# Patient Record
Sex: Female | Born: 1960 | Race: White | Hispanic: No | Marital: Single | Smoking: Former smoker
Health system: Southern US, Community
[De-identification: ages and names within clinical notes are randomized; demographics above are authoritative.]

## PROBLEM LIST (undated history)

## (undated) DIAGNOSIS — R519 Headache, unspecified: Secondary | ICD-10-CM

## (undated) DIAGNOSIS — R112 Nausea with vomiting, unspecified: Secondary | ICD-10-CM

## (undated) DIAGNOSIS — E785 Hyperlipidemia, unspecified: Secondary | ICD-10-CM

## (undated) DIAGNOSIS — Z9889 Other specified postprocedural states: Secondary | ICD-10-CM

## (undated) DIAGNOSIS — N6320 Unspecified lump in the left breast, unspecified quadrant: Secondary | ICD-10-CM

## (undated) DIAGNOSIS — Z803 Family history of malignant neoplasm of breast: Secondary | ICD-10-CM

## (undated) DIAGNOSIS — C801 Malignant (primary) neoplasm, unspecified: Secondary | ICD-10-CM

## (undated) DIAGNOSIS — T4145XA Adverse effect of unspecified anesthetic, initial encounter: Secondary | ICD-10-CM

## (undated) DIAGNOSIS — E079 Disorder of thyroid, unspecified: Secondary | ICD-10-CM

## (undated) DIAGNOSIS — Z808 Family history of malignant neoplasm of other organs or systems: Secondary | ICD-10-CM

## (undated) DIAGNOSIS — Z8041 Family history of malignant neoplasm of ovary: Secondary | ICD-10-CM

## (undated) DIAGNOSIS — Z8042 Family history of malignant neoplasm of prostate: Secondary | ICD-10-CM

## (undated) DIAGNOSIS — K219 Gastro-esophageal reflux disease without esophagitis: Secondary | ICD-10-CM

## (undated) DIAGNOSIS — Z853 Personal history of malignant neoplasm of breast: Secondary | ICD-10-CM

## (undated) DIAGNOSIS — E039 Hypothyroidism, unspecified: Secondary | ICD-10-CM

## (undated) DIAGNOSIS — I10 Essential (primary) hypertension: Secondary | ICD-10-CM

## (undated) DIAGNOSIS — T8859XA Other complications of anesthesia, initial encounter: Secondary | ICD-10-CM

## (undated) DIAGNOSIS — F419 Anxiety disorder, unspecified: Secondary | ICD-10-CM

## (undated) HISTORY — PX: CHOLECYSTECTOMY: SHX55

## (undated) HISTORY — DX: Family history of malignant neoplasm of other organs or systems: Z80.8

## (undated) HISTORY — DX: Essential (primary) hypertension: I10

## (undated) HISTORY — PX: BREAST SURGERY: SHX581

## (undated) HISTORY — DX: Family history of malignant neoplasm of ovary: Z80.41

## (undated) HISTORY — DX: Family history of malignant neoplasm of prostate: Z80.42

## (undated) HISTORY — PX: HEMORRHOID SURGERY: SHX153

## (undated) HISTORY — DX: Disorder of thyroid, unspecified: E07.9

## (undated) HISTORY — PX: OTHER SURGICAL HISTORY: SHX169

## (undated) HISTORY — DX: Family history of malignant neoplasm of breast: Z80.3

## (undated) HISTORY — DX: Hyperlipidemia, unspecified: E78.5

## (undated) HISTORY — PX: TUBAL LIGATION: SHX77

---
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
## 2006-04-21 ENCOUNTER — Emergency Department: Payer: Self-pay | Admitting: General Practice

## 2007-07-10 ENCOUNTER — Emergency Department: Payer: Self-pay | Admitting: Internal Medicine

## 2017-01-22 DIAGNOSIS — Z139 Encounter for screening, unspecified: Secondary | ICD-10-CM

## 2017-01-22 LAB — GLUCOSE, POCT (MANUAL RESULT ENTRY): POC Glucose: 106 mg/dl — AB (ref 70–99)

## 2017-01-22 NOTE — Congregational Nurse Program (Signed)
Congregational Nurse Program Note  Date of Encounter: 01/22/2017  Past Medical History: No past medical history on file.  Encounter Details:     CNP Questionnaire - 01/22/17 1530      Patient Demographics   Is this a new or existing patient? New   Patient is considered a/an Not Applicable   Race Caucasian/White     Patient Assistance   Location of Patient [place]   Patient's financial/insurance status Low Income;Self-Pay (Uninsured)   Uninsured Patient ([place]) Yes   Patient referred to apply for the following financial assistance Not Applicable   Food insecurities addressed Referred to food bank or resource   Transportation assistance No   Assistance securing medications No   Doctor, hospital the healthcare system;Medications;Hypertension;Chronic disease;Nutrition     Encounter Details   Primary purpose of visit Chronic Illness/Condition Visit;Education/Health Concerns;Navigating the Healthcare System   Was an Emergency Department visit averted? Not Applicable   Does patient have a medical provider? No   Patient referred to Area Agency;Clinic;Establish PCP   Was a mental health screening completed? (GAINS tool) No   Does patient have dental issues? No   Does patient have vision issues? No   Does your patient have an abnormal blood pressure today? No   Since previous encounter, have you referred patient for abnormal blood pressure that resulted in a new diagnosis or medication change? No   Does your patient have an abnormal blood glucose today? No   Since previous encounter, have you referred patient for abnormal blood glucose that resulted in a new diagnosis or medication change? No   Was there a life-saving intervention made? No      New client with [facility]. Client moved to [facility] last October 2017. She is unemployed and lives alone currently. She states her former boyfriend is paying her  bills and food and letting her stay in his house for now. She is trying to find employment and will be taking [employer] classes at [employer] starting next week.  food stamps due to qualifications. She states she was last seen by a medical provider in [place] last August. She states she has been treated for hypertension, pre-diabetes by diet, Increased cholesterol and thyroid issues. She states she has 4 lisinopril left.  No other complaints today other than worried she will not be able to get her medications and finding employment. Discussed with client options for medical providers without insurance and she prefers a referral into the [facility], referral made and appointment secured for 01/28/17 at 1:00 pm.  Client concerned she starts class on Monday and class ends at 1 pm .  RN suggested client check with class to inquire if she can leave a little early for a medical appointment. Client is going by today. Client will notify RN if she cannot be released from class early on the 01/28/17. Contact information given for the [facility], [employer] as well as RN's work number  Alert and oriented to person and place, answers appropriately. Client very talkative, no apparent distress. Denies thoughts of suicide. Client offered to meet with [employer] work interns for counseling and possible case management regarding classes or offerings for job skills , resume Automotive engineer. Client prefers to wait on a possible part-time job she may be getting first.  Will follow as needed.

## 2017-01-28 ENCOUNTER — Encounter: Payer: Self-pay | Admitting: Physician Assistant

## 2017-01-28 ENCOUNTER — Ambulatory Visit: Payer: Self-pay | Admitting: Physician Assistant

## 2017-01-28 VITALS — BP 152/86 | HR 99 | Temp 97.9°F | Ht 61.0 in | Wt 156.5 lb

## 2017-01-28 DIAGNOSIS — K219 Gastro-esophageal reflux disease without esophagitis: Secondary | ICD-10-CM

## 2017-01-28 DIAGNOSIS — Z1239 Encounter for other screening for malignant neoplasm of breast: Secondary | ICD-10-CM

## 2017-01-28 DIAGNOSIS — E039 Hypothyroidism, unspecified: Secondary | ICD-10-CM | POA: Insufficient documentation

## 2017-01-28 DIAGNOSIS — R7303 Prediabetes: Secondary | ICD-10-CM

## 2017-01-28 DIAGNOSIS — I10 Essential (primary) hypertension: Secondary | ICD-10-CM

## 2017-01-28 DIAGNOSIS — E785 Hyperlipidemia, unspecified: Secondary | ICD-10-CM

## 2017-01-28 MED ORDER — LISINOPRIL 5 MG PO TABS: 5.0000 mg | ORAL_TABLET | Freq: Every day | ORAL | 1 refills | Status: DC

## 2017-01-28 MED ORDER — LEVOTHYROXINE SODIUM 88 MCG PO TABS: 88.0000 ug | ORAL_TABLET | Freq: Every day | ORAL | 1 refills | Status: DC

## 2017-01-28 NOTE — Progress Notes (Signed)
BP (!) 152/86 (BP Location: Left Arm, Patient Position: Sitting, Cuff Size: Normal)   Pulse 99   Temp 97.9 F (36.6 C) (Other (Comment))   Ht 5\' 1"  (1.549 m)   Wt 156 lb 8 oz (71 kg)   SpO2 98%   BMI 29.57 kg/m    Subjective:    Patient ID: Emily Clark, female    DOB: 14-Jan-1961, 56 y.o.   MRN: [id number]  HPI: Emily Clark is a 56 y.o. female presenting on 01/28/2017 for New Patient (Initial Visit)   HPI   Pt moved from [place] recently.    Pt states she has been upset since yesterday.  Previously bp well controlled on lisinopril 5mg .  bp was good last week when checked by congregational nursing.   Pt states some sinus headache comes and goes for past week.  Pt says her last PAP was september 2017 but she doesn't remember the name of the doctor (in [place],  [place])  She says she has Never had mammogram  Relevant past medical, surgical, family and social history reviewed and updated as indicated. Interim medical history since our last visit reviewed. Allergies and medications reviewed and updated.   Current Outpatient Prescriptions:  .  levothyroxine (SYNTHROID, LEVOTHROID) 88 MCG tablet, Take 88 mcg by mouth 2 (two) times daily., Disp: , Rfl:  .  lisinopril (PRINIVIL,ZESTRIL) 5 MG tablet, Take 5 mg by mouth daily., Disp: , Rfl:  .  ranitidine (ZANTAC) 150 MG tablet, Take 150 mg by mouth daily as needed for heartburn., Disp: , Rfl:  .  gemfibrozil (LOPID) 600 MG tablet, Take 600 mg by mouth 2 (two) times daily before a meal., Disp: , Rfl:  .  Omega-3 Fatty Acids (FISH OIL) 1000 MG CAPS, Take by mouth., Disp: , Rfl:    Review of Systems  Constitutional: Negative for appetite change, chills, diaphoresis, fatigue, fever and unexpected weight change.  HENT: Positive for congestion and sneezing. Negative for dental problem, drooling, ear pain, facial swelling, hearing loss, mouth sores, sore throat, trouble swallowing and voice change.   Eyes: Negative for pain,  discharge, redness, itching and visual disturbance.  Respiratory: Negative for cough, choking, shortness of breath and wheezing.   Cardiovascular: Negative for chest pain, palpitations and leg swelling.  Gastrointestinal: Negative for abdominal pain, blood in stool, constipation, diarrhea and vomiting.  Endocrine: Negative for cold intolerance, heat intolerance and polydipsia.  Genitourinary: Negative for decreased urine volume, dysuria and hematuria.  Musculoskeletal: Negative for arthralgias, back pain and gait problem.  Skin: Negative for rash.  Allergic/Immunologic: Negative for environmental allergies.  Neurological: Positive for headaches. Negative for seizures, syncope and light-headedness.  Hematological: Negative for adenopathy.  Psychiatric/Behavioral: Negative for agitation, dysphoric mood and suicidal ideas. The patient is not nervous/anxious.     Per HPI unless specifically indicated above     Objective:    BP (!) 152/86 (BP Location: Left Arm, Patient Position: Sitting, Cuff Size: Normal)   Pulse 99   Temp 97.9 F (36.6 C) (Other (Comment))   Ht 5\' 1"  (1.549 m)   Wt 156 lb 8 oz (71 kg)   SpO2 98%   BMI 29.57 kg/m   Wt Readings from Last 3 Encounters:  01/28/17 156 lb 8 oz (71 kg)  01/22/17 159 lb 12.8 oz (72.5 kg)    Physical Exam  Constitutional: She is oriented to person, place, and time. She appears well-developed and well-nourished.  HENT:  Head: Normocephalic and atraumatic.  Mouth/Throat: Oropharynx is  clear and moist. No oropharyngeal exudate.  Eyes: Conjunctivae and EOM are normal. Pupils are equal, round, and reactive to light.  Neck: Neck supple. No thyromegaly present.  Cardiovascular: Normal rate and regular rhythm.   Pulmonary/Chest: Effort normal and breath sounds normal.  Abdominal: Soft. Bowel sounds are normal. She exhibits no mass. There is no hepatosplenomegaly. There is no tenderness.  Musculoskeletal: She exhibits no edema.   Lymphadenopathy:    She has no cervical adenopathy.  Neurological: She is alert and oriented to person, place, and time. Gait normal.  Skin: Skin is warm and dry.  Psychiatric: She has a normal mood and affect. Her behavior is normal.  Vitals reviewed.   Results for orders placed or performed in visit on 01/22/17  POCT glucose  Result Value Ref Range   POC Glucose 106 (A) 70 - 99 mg/dl      Assessment & Plan:   Encounter Diagnoses  Name Primary?  . Essential hypertension Yes  . Hyperlipidemia, unspecified hyperlipidemia type   . Hypothyroidism, unspecified type   . Gastroesophageal reflux disease, esophagitis presence not specified   . Prediabetes   . Screening for breast cancer      -will order screening mammogram -pt to get baseline Labs drawn -will Continue current levothyroxine and lisinopril -Pt will bring name of doctor who did her PAP to her next OV -pt to follow up in 1 month.  RTO sooner prn

## 2017-01-29 LAB — CBC WITH DIFFERENTIAL/PLATELET
BASOS PCT: 1 %
Basophils Absolute: 66 cells/uL (ref 0–200)
Eosinophils Absolute: 66 cells/uL (ref 15–500)
Eosinophils Relative: 1 %
HCT: 39.1 % (ref 35.0–45.0)
Hemoglobin: 13.3 g/dL (ref 11.7–15.5)
LYMPHS PCT: 40 %
Lymphs Abs: 2640 cells/uL (ref 850–3900)
MCH: 30.6 pg (ref 27.0–33.0)
MCHC: 34 g/dL (ref 32.0–36.0)
MCV: 90.1 fL (ref 80.0–100.0)
MONO ABS: 660 {cells}/uL (ref 200–950)
MPV: 9.4 fL (ref 7.5–12.5)
Monocytes Relative: 10 %
NEUTROS PCT: 48 %
Neutro Abs: 3168 cells/uL (ref 1500–7800)
Platelets: 346 10*3/uL (ref 140–400)
RBC: 4.34 MIL/uL (ref 3.80–5.10)
RDW: 14.1 % (ref 11.0–15.0)
WBC: 6.6 10*3/uL (ref 3.8–10.8)

## 2017-01-29 LAB — COMPREHENSIVE METABOLIC PANEL
ALT: 28 U/L (ref 6–29)
AST: 26 U/L (ref 10–35)
Albumin: 4.5 g/dL (ref 3.6–5.1)
Alkaline Phosphatase: 84 U/L (ref 33–130)
BUN: 18 mg/dL (ref 7–25)
CO2: 29 mmol/L (ref 20–31)
CREATININE: 0.84 mg/dL (ref 0.50–1.05)
Calcium: 9.6 mg/dL (ref 8.6–10.4)
Chloride: 103 mmol/L (ref 98–110)
Glucose, Bld: 125 mg/dL — ABNORMAL HIGH (ref 65–99)
Potassium: 4 mmol/L (ref 3.5–5.3)
Sodium: 143 mmol/L (ref 135–146)
Total Bilirubin: 0.4 mg/dL (ref 0.2–1.2)
Total Protein: 6.9 g/dL (ref 6.1–8.1)

## 2017-01-29 LAB — LIPID PANEL
CHOLESTEROL: 273 mg/dL — AB (ref <200)
HDL: 52 mg/dL (ref >50)
LDL Cholesterol: 181 mg/dL — ABNORMAL HIGH (ref <100)
TRIGLYCERIDES: 198 mg/dL — AB (ref <150)
Total CHOL/HDL Ratio: 5.3 Ratio — ABNORMAL HIGH (ref <5.0)
VLDL: 40 mg/dL — ABNORMAL HIGH (ref <30)

## 2017-01-29 LAB — TSH: TSH: 13.41 mIU/L — ABNORMAL HIGH

## 2017-01-30 ENCOUNTER — Telehealth: Payer: Self-pay

## 2017-01-30 LAB — HEMOGLOBIN A1C
Hgb A1c MFr Bld: 5.8 % — ABNORMAL HIGH (ref <5.7)
Mean Plasma Glucose: 120 mg/dL

## 2017-01-30 NOTE — Telephone Encounter (Signed)
Pt was called today by nurse to follow-up with her visit from the [facility] 01/28/17.  Pt said she had blood drawn yesterday 01/29/17.   Pt stated she was depressed because she can't find a job that she wants paying 47 and with insurance.  Nurse offered to come in at [facility] to talk to our [employer].   Pt sates she already has resources like her friends and a questionable number to talk to someone to pray about it.  Nurse offered to call back if she needs any help and we could help as much as we can.   Camren Lipsett R. Hadasah Brugger LPN [phone]

## 2017-02-21 ENCOUNTER — Ambulatory Visit (HOSPITAL_COMMUNITY): Payer: Self-pay

## 2017-02-27 ENCOUNTER — Ambulatory Visit: Payer: Self-pay | Admitting: Physician Assistant

## 2017-02-28 ENCOUNTER — Ambulatory Visit (HOSPITAL_COMMUNITY): Payer: Self-pay

## 2017-02-28 ENCOUNTER — Encounter (HOSPITAL_COMMUNITY): Payer: Self-pay

## 2017-03-27 ENCOUNTER — Ambulatory Visit: Payer: Self-pay | Admitting: Physician Assistant

## 2017-03-27 ENCOUNTER — Encounter: Payer: Self-pay | Admitting: Physician Assistant

## 2017-03-27 VITALS — BP 146/88 | HR 116 | Temp 98.8°F | Ht 61.0 in | Wt 155.5 lb

## 2017-03-27 DIAGNOSIS — J069 Acute upper respiratory infection, unspecified: Secondary | ICD-10-CM

## 2017-03-27 DIAGNOSIS — E785 Hyperlipidemia, unspecified: Secondary | ICD-10-CM

## 2017-03-27 DIAGNOSIS — R000 Tachycardia, unspecified: Secondary | ICD-10-CM

## 2017-03-27 DIAGNOSIS — I10 Essential (primary) hypertension: Secondary | ICD-10-CM

## 2017-03-27 DIAGNOSIS — K219 Gastro-esophageal reflux disease without esophagitis: Secondary | ICD-10-CM

## 2017-03-27 DIAGNOSIS — E039 Hypothyroidism, unspecified: Secondary | ICD-10-CM

## 2017-03-27 DIAGNOSIS — R509 Fever, unspecified: Secondary | ICD-10-CM

## 2017-03-27 MED ORDER — LISINOPRIL 5 MG PO TABS: 5.0000 mg | ORAL_TABLET | Freq: Every day | ORAL | 4 refills | Status: DC

## 2017-03-27 MED ORDER — ACETAMINOPHEN 325 MG PO TABS
650.0000 mg | ORAL_TABLET | Freq: Once | ORAL | Status: AC
  Administered 2017-03-27: 650 mg via ORAL

## 2017-03-27 MED ORDER — LEVOTHYROXINE SODIUM 88 MCG PO TABS: 88.0000 ug | ORAL_TABLET | Freq: Every day | ORAL | 4 refills | Status: DC

## 2017-03-27 MED ORDER — ATENOLOL 25 MG PO TABS: 25.0000 mg | ORAL_TABLET | Freq: Every day | ORAL | 1 refills | Status: DC

## 2017-03-27 NOTE — Progress Notes (Signed)
BP (!) 146/88   Pulse (!) 127   Temp 99.5 F (37.5 C)   Wt 155 lb 8 oz (70.5 kg)   SpO2 98%   BMI 29.38 kg/m    Subjective:    Patient ID: Emily Clark, female    DOB: 07-19-1961, 56 y.o.   MRN: [id number]  HPI: Emily Clark is a 56 y.o. female presenting on 03/27/2017 for Hypertension; Hyperlipidemia; Thyroid Problem; and Cough (pt states she woke up not feeling well. pt states as the day progresses she is feeling worse.)   HPI    Chief Complaint  Patient presents with  . Hypertension  . Hyperlipidemia  . Thyroid Problem  . Cough    pt states she woke up not feeling well. pt states as the day progresses she is feeling worse.    Pt here for routine follow up but says she feels bad.  She says she was well yesterday and just awakened today feeling bad.   C/o cough. Also L earach.  + nausea. No emesis.  A little bit of diarrhea  Pt Wants rx for retin-A to prevent wrinkles.  She also uses castor oil for this.   Pt asks about getting bladder tacked back up.  Her gyn said she needed that.  She says she leaks urine, a lot at times.   Relevant past medical, surgical, family and social history reviewed and updated as indicated. Interim medical history since our last visit reviewed. Allergies and medications reviewed and updated.  CURRENT MEDS: Levothyroxine Lisinopril Fish oil OTC prilosec  Review of Systems  Constitutional: Negative for appetite change, chills, diaphoresis, fatigue, fever and unexpected weight change.  HENT: Positive for ear pain. Negative for congestion, dental problem, drooling, facial swelling, hearing loss, mouth sores, sneezing, sore throat, trouble swallowing and voice change.   Eyes: Negative for pain, discharge, redness, itching and visual disturbance.  Respiratory: Negative for cough, choking, shortness of breath and wheezing.   Cardiovascular: Negative for chest pain, palpitations and leg swelling.  Gastrointestinal: Negative for  abdominal pain, blood in stool, constipation, diarrhea and vomiting.  Endocrine: Negative for cold intolerance, heat intolerance and polydipsia.  Genitourinary: Negative for decreased urine volume, dysuria and hematuria.  Musculoskeletal: Negative for arthralgias, back pain and gait problem.  Skin: Negative for rash.  Allergic/Immunologic: Negative for environmental allergies.  Neurological: Negative for seizures, syncope, light-headedness and headaches.  Hematological: Negative for adenopathy.  Psychiatric/Behavioral: Negative for agitation, dysphoric mood and suicidal ideas. The patient is not nervous/anxious.     Per HPI unless specifically indicated above     Objective:    BP (!) 146/88   Pulse (!) 127   Temp 99.5 F (37.5 C)   Wt 155 lb 8 oz (70.5 kg)   SpO2 98%   BMI 29.38 kg/m   Wt Readings from Last 3 Encounters:  03/27/17 155 lb 8 oz (70.5 kg)  01/28/17 156 lb 8 oz (71 kg)  01/22/17 159 lb 12.8 oz (72.5 kg)    Physical Exam  Constitutional: She is oriented to person, place, and time. She appears well-developed and well-nourished.  HENT:  Head: Normocephalic and atraumatic.  Right Ear: Hearing, tympanic membrane, external ear and ear canal normal.  Left Ear: Hearing, tympanic membrane, external ear and ear canal normal.  Nose: Nose normal.  Mouth/Throat: Uvula is midline and oropharynx is clear and moist. No oropharyngeal exudate.  Neck: Neck supple.  Cardiovascular: Normal rate and regular rhythm.   Pulmonary/Chest: Effort normal and  breath sounds normal. She has no wheezes.  Abdominal: Soft. Bowel sounds are normal. She exhibits no mass. There is no hepatosplenomegaly. There is no tenderness.  Musculoskeletal: She exhibits no edema.  Lymphadenopathy:    She has no cervical adenopathy.  Neurological: She is alert and oriented to person, place, and time.  Skin: Skin is warm and dry.  Psychiatric: She has a normal mood and affect. Her behavior is normal.  Vitals  reviewed.   Results for orders placed or performed in visit on 01/28/17  CBC w/Diff/Platelet  Result Value Ref Range   WBC 6.6 3.8 - 10.8 K/uL   RBC 4.34 3.80 - 5.10 MIL/uL   Hemoglobin 13.3 11.7 - 15.5 g/dL   HCT 39.1 35.0 - 45.0 %   MCV 90.1 80.0 - 100.0 fL   MCH 30.6 27.0 - 33.0 pg   MCHC 34.0 32.0 - 36.0 g/dL   RDW 14.1 11.0 - 15.0 %   Platelets 346 140 - 400 K/uL   MPV 9.4 7.5 - 12.5 fL   Neutro Abs 3,168 1,500 - 7,800 cells/uL   Lymphs Abs 2,640 850 - 3,900 cells/uL   Monocytes Absolute 660 200 - 950 cells/uL   Eosinophils Absolute 66 15 - 500 cells/uL   Basophils Absolute 66 0 - 200 cells/uL   Neutrophils Relative % 48 %   Lymphocytes Relative 40 %   Monocytes Relative 10 %   Eosinophils Relative 1 %   Basophils Relative 1 %   Smear Review Criteria for review not met   TSH  Result Value Ref Range   TSH 13.41 (H) mIU/L  Lipid Profile  Result Value Ref Range   Cholesterol 273 (H) <200 mg/dL   Triglycerides 198 (H) <150 mg/dL   HDL 52 >50 mg/dL   Total CHOL/HDL Ratio 5.3 (H) <5.0 Ratio   VLDL 40 (H) <30 mg/dL   LDL Cholesterol 181 (H) <100 mg/dL  Comprehensive Metabolic Panel (CMET)  Result Value Ref Range   Sodium 143 135 - 146 mmol/L   Potassium 4.0 3.5 - 5.3 mmol/L   Chloride 103 98 - 110 mmol/L   CO2 29 20 - 31 mmol/L   Glucose, Bld 125 (H) 65 - 99 mg/dL   BUN 18 7 - 25 mg/dL   Creat 0.84 0.50 - 1.05 mg/dL   Total Bilirubin 0.4 0.2 - 1.2 mg/dL   Alkaline Phosphatase 84 33 - 130 U/L   AST 26 10 - 35 U/L   ALT 28 6 - 29 U/L   Total Protein 6.9 6.1 - 8.1 g/dL   Albumin 4.5 3.6 - 5.1 g/dL   Calcium 9.6 8.6 - 10.4 mg/dL  HgB A1c  Result Value Ref Range   Hgb A1c MFr Bld 5.8 (H) <5.7 %   Mean Plasma Glucose 120 mg/dL      Assessment & Plan:   Encounter Diagnoses  Name Primary?  . Fever, unspecified fever cause Yes  . Tachycardia   . Viral upper respiratory tract infection   . Essential hypertension   . Hyperlipidemia, unspecified hyperlipidemia  type   . Hypothyroidism, unspecified type   . Gastroesophageal reflux disease, esophagitis presence not specified     -reviewed labs with pt -pt was given tylenol in office.  Temp decreased and pulse decreased and she reported feeling improved -Pt says she can get lipids down with her diet.  She doesn't want rx -Encouraged pt to stay home from work tonight due to sick. She is given note to be  out of work -will Continue current levothyroxine -Change lisinopril to atenolol to help pulse -f/u 1 month.  RTO sooner prn.  Told pt will discuss her multitude of issues at follow up appointment

## 2017-04-21 ENCOUNTER — Ambulatory Visit: Payer: Self-pay | Admitting: Physician Assistant

## 2017-04-21 ENCOUNTER — Encounter: Payer: Self-pay | Admitting: Physician Assistant

## 2017-04-21 VITALS — BP 130/86 | HR 91 | Temp 97.7°F | Ht 61.0 in | Wt 155.8 lb

## 2017-04-21 DIAGNOSIS — E785 Hyperlipidemia, unspecified: Secondary | ICD-10-CM

## 2017-04-21 DIAGNOSIS — Z1239 Encounter for other screening for malignant neoplasm of breast: Secondary | ICD-10-CM

## 2017-04-21 DIAGNOSIS — I10 Essential (primary) hypertension: Secondary | ICD-10-CM

## 2017-04-21 DIAGNOSIS — E039 Hypothyroidism, unspecified: Secondary | ICD-10-CM

## 2017-04-21 MED ORDER — LEVOTHYROXINE SODIUM 100 MCG PO TABS: 100.0000 ug | ORAL_TABLET | Freq: Every day | ORAL | 3 refills | Status: DC

## 2017-04-21 NOTE — Progress Notes (Signed)
BP 130/86 (BP Location: Left Arm, Patient Position: Sitting, Cuff Size: Normal)   Pulse 91   Temp 97.7 F (36.5 C)   Ht 5\' 1"  (1.549 m)   Wt 155 lb 12 oz (70.6 kg)   SpO2 98%   BMI 29.43 kg/m    Subjective:    Patient ID: Emily Clark, female    DOB: Jan 28, 1961, 56 y.o.   MRN: [id number]  HPI: Emily Clark is a 56 y.o. female presenting on 04/21/2017 for Hypertension   HPI   Pt says she is feeling better than at previous OV.  She says she is tired a lot and feeling like she does need her thyroid med ajusted- feels tired  Pt works at [employer] says she will likely be getting insurance soon because she doesn't want the penalty at the end of the year.   Relevant past medical, surgical, family and social history reviewed and updated as indicated. Interim medical history since our last visit reviewed. Allergies and medications reviewed and updated.   Current Outpatient Prescriptions:  .  atenolol (TENORMIN) 25 MG tablet, Take 1 tablet (25 mg total) by mouth daily., Disp: 30 tablet, Rfl: 1 .  levothyroxine (SYNTHROID, LEVOTHROID) 88 MCG tablet, Take 1 tablet (88 mcg total) by mouth daily., Disp: 30 tablet, Rfl: 4 .  Omega-3 Fatty Acids (FISH OIL) 1000 MG CAPS, Take by mouth., Disp: , Rfl:    Review of Systems  Constitutional: Negative for appetite change, chills, diaphoresis, fatigue, fever and unexpected weight change.  HENT: Negative for congestion, dental problem, drooling, ear pain, facial swelling, hearing loss, mouth sores, sneezing, sore throat, trouble swallowing and voice change.   Eyes: Negative for pain, discharge, redness, itching and visual disturbance.  Respiratory: Negative for cough, choking, shortness of breath and wheezing.   Cardiovascular: Negative for chest pain, palpitations and leg swelling.  Gastrointestinal: Negative for abdominal pain, blood in stool, constipation, diarrhea and vomiting.  Endocrine: Negative for cold intolerance, heat  intolerance and polydipsia.  Genitourinary: Negative for decreased urine volume, dysuria and hematuria.  Musculoskeletal: Negative for arthralgias, back pain and gait problem.  Skin: Negative for rash.  Allergic/Immunologic: Negative for environmental allergies.  Neurological: Negative for seizures, syncope, light-headedness and headaches.  Hematological: Negative for adenopathy.  Psychiatric/Behavioral: Negative for agitation, dysphoric mood and suicidal ideas. The patient is not nervous/anxious.     Per HPI unless specifically indicated above     Objective:    BP 130/86 (BP Location: Left Arm, Patient Position: Sitting, Cuff Size: Normal)   Pulse 91   Temp 97.7 F (36.5 C)   Ht 5\' 1"  (1.549 m)   Wt 155 lb 12 oz (70.6 kg)   SpO2 98%   BMI 29.43 kg/m   Wt Readings from Last 3 Encounters:  04/21/17 155 lb 12 oz (70.6 kg)  03/27/17 155 lb 8 oz (70.5 kg)  01/28/17 156 lb 8 oz (71 kg)    Physical Exam  Constitutional: She is oriented to person, place, and time. She appears well-developed and well-nourished.  HENT:  Head: Normocephalic and atraumatic.  Neck: Neck supple.  Cardiovascular: Normal rate and regular rhythm.   Pulmonary/Chest: Effort normal and breath sounds normal.  Abdominal: Soft. Bowel sounds are normal. She exhibits no mass. There is no hepatosplenomegaly. There is no tenderness.  Musculoskeletal: She exhibits no edema.  Lymphadenopathy:    She has no cervical adenopathy.  Neurological: She is alert and oriented to person, place, and time.  Skin: Skin is warm and dry.  Psychiatric: She has a normal mood and affect. Her behavior is normal.  Vitals reviewed.       Assessment & Plan:   Encounter Diagnoses  Name Primary?  . Hypothyroidism, unspecified type Yes  . Essential hypertension   . Hyperlipidemia, unspecified hyperlipidemia type   . Screening for breast cancer      -Pt doesn't want medication for lipids. She isn't eating lowfat yet but says  she is planning to.  She doesn't want rx. -Increase synthroid to 176mcg- recheck 3 months -will reschedule mammogram -pt states she had colonoscopy with polyps - ? In 2016 or 2017- Pt will bring name of dr to next appt so we can request those records -F/u 3 months. RTO sooner prn.  Pt to cancel appointment if she has insurance at that time

## 2017-05-01 ENCOUNTER — Encounter: Payer: Self-pay | Admitting: Physician Assistant

## 2017-06-13 ENCOUNTER — Other Ambulatory Visit: Payer: Self-pay | Admitting: Physician Assistant

## 2017-07-01 ENCOUNTER — Encounter: Payer: Self-pay | Admitting: Physician Assistant

## 2017-07-02 NOTE — Progress Notes (Addendum)
Pt called and left voicemail on the weekend of 06-27-17 c/o really bad allergies and having watery eyes for 2 months. Pt states she has tried zyrtec, Claritin, allegra, eye drops, and nasal spray. Pt asking if [facility] gave out the allergy shot.   LPN called and spoke with patient on 07-01-17 and notified pt that the office does not provide allergy shots, but could schedule patient an appointment for 07-02-17, considering she has already taken several OTC allergy medicines and has been symptomatic for 2 months now.   Pt stated she didn't think coming in to be seen would help. Pt was explained that being seen could help so she could be examined in order to see if it is something other than allergies. Pt states she knows she has allergies and knows that all she needs is an "allergy shot" in order to get better. Pt refused to schedule appointment.  Pt was advised to call back if she changed her mind about scheduling an appointment. Pt verbalized understanding.

## 2017-07-16 ENCOUNTER — Other Ambulatory Visit: Payer: Self-pay | Admitting: Physician Assistant

## 2017-07-16 DIAGNOSIS — Z1231 Encounter for screening mammogram for malignant neoplasm of breast: Secondary | ICD-10-CM

## 2017-07-23 ENCOUNTER — Ambulatory Visit: Payer: Self-pay | Admitting: Physician Assistant

## 2017-07-24 ENCOUNTER — Ambulatory Visit: Payer: Self-pay | Admitting: Physician Assistant

## 2017-08-07 ENCOUNTER — Telehealth (HOSPITAL_COMMUNITY): Payer: Self-pay

## 2017-08-07 NOTE — Telephone Encounter (Signed)
Called patient to schedule mammogram appointment with [facility] ([facility] sent [place] patient's info for mammo scholarship). Patient said she was not interested in having a mammogram done at this time even though she has never had one before. I spoke with her about the importance of having a yearly mammogram done and told her she could call us back if she changed her mind.

## 2017-09-08 ENCOUNTER — Ambulatory Visit: Payer: Self-pay | Admitting: Physician Assistant

## 2017-09-08 ENCOUNTER — Encounter: Payer: Self-pay | Admitting: Physician Assistant

## 2017-09-08 VITALS — BP 144/80 | HR 105 | Temp 97.9°F | Ht 61.0 in | Wt 156.0 lb

## 2017-09-08 DIAGNOSIS — I10 Essential (primary) hypertension: Secondary | ICD-10-CM

## 2017-09-08 DIAGNOSIS — E785 Hyperlipidemia, unspecified: Secondary | ICD-10-CM

## 2017-09-08 DIAGNOSIS — E039 Hypothyroidism, unspecified: Secondary | ICD-10-CM

## 2017-09-08 DIAGNOSIS — J019 Acute sinusitis, unspecified: Secondary | ICD-10-CM

## 2017-09-08 DIAGNOSIS — Z5320 Procedure and treatment not carried out because of patient's decision for unspecified reasons: Secondary | ICD-10-CM | POA: Insufficient documentation

## 2017-09-08 MED ORDER — SULFAMETHOXAZOLE-TRIMETHOPRIM 800-160 MG PO TABS: 1.0000 | ORAL_TABLET | Freq: Two times a day (BID) | ORAL | 0 refills | Status: AC

## 2017-09-08 MED ORDER — ATENOLOL 25 MG PO TABS: 25.0000 mg | ORAL_TABLET | Freq: Every day | ORAL | 1 refills | Status: DC

## 2017-09-08 MED ORDER — LEVOTHYROXINE SODIUM 100 MCG PO TABS: 100.0000 ug | ORAL_TABLET | Freq: Every day | ORAL | 1 refills | Status: DC

## 2017-09-08 NOTE — Progress Notes (Signed)
BP (!) 144/80 (BP Location: Left Arm, Patient Position: Sitting, Cuff Size: Normal)   Pulse (!) 105   Temp 97.9 F (36.6 C)   Ht 5\' 1"  (1.549 m)   Wt 156 lb (70.8 kg)   SpO2 97%   BMI 29.48 kg/m    Subjective:    Patient ID: Emily Clark, female    DOB: 05/09/1961, 56 y.o.   MRN: [id number]  HPI:.   Emily Clark is a 56 y.o. female presenting on 09/08/2017 for Allergies   HPI   States eyes dripping water.  Pain behind eyes for couple of months.  Sudafed helped some.  Most OTCs didn't help any.  Lots of pressure and pain. + congestion.   Pt says she missed her appointments last month due to no money- spent it on her car.   Pt still working at [employer]  Relevant past medical, surgical, family and social history reviewed and updated as indicated. Interim medical history since our last visit reviewed. Allergies and medications reviewed and updated.    Current Outpatient Prescriptions:  .  Flaxseed, Linseed, (FLAX SEED OIL PO), Take by mouth every other day., Disp: , Rfl:  .  levothyroxine (SYNTHROID, LEVOTHROID) 100 MCG tablet, Take 1 tablet (100 mcg total) by mouth daily., Disp: 30 tablet, Rfl: 3 .  atenolol (TENORMIN) 25 MG tablet, TAKE 1 TABLET BY MOUTH ONCE DAILY (Patient not taking: Reported on 09/08/2017), Disp: 30 tablet, Rfl: 1 .  Omega-3 Fatty Acids (FISH OIL) 1000 MG CAPS, Take by mouth., Disp: , Rfl:   Review of Systems  Constitutional: Negative for appetite change, chills, diaphoresis, fatigue, fever and unexpected weight change.  HENT: Positive for congestion and facial swelling. Negative for dental problem, drooling, ear pain, hearing loss, mouth sores, sneezing, sore throat, trouble swallowing and voice change.   Eyes: Positive for redness and visual disturbance. Negative for pain, discharge and itching.  Respiratory: Negative for cough, choking, shortness of breath and wheezing.   Cardiovascular: Negative for chest pain, palpitations and leg  swelling.  Gastrointestinal: Negative for abdominal pain, blood in stool, constipation, diarrhea and vomiting.  Endocrine: Negative for cold intolerance, heat intolerance and polydipsia.  Genitourinary: Negative for decreased urine volume, dysuria and hematuria.  Musculoskeletal: Negative for arthralgias, back pain and gait problem.  Skin: Negative for rash.  Allergic/Immunologic: Positive for environmental allergies.  Neurological: Positive for headaches. Negative for seizures, syncope and light-headedness.  Hematological: Negative for adenopathy.  Psychiatric/Behavioral: Negative for agitation, dysphoric mood and suicidal ideas. The patient is not nervous/anxious.     Per HPI unless specifically indicated above     Objective:    BP (!) 144/80 (BP Location: Left Arm, Patient Position: Sitting, Cuff Size: Normal)   Pulse (!) 105   Temp 97.9 F (36.6 C)   Ht 5\' 1"  (1.549 m)   Wt 156 lb (70.8 kg)   SpO2 97%   BMI 29.48 kg/m   Wt Readings from Last 3 Encounters:  09/08/17 156 lb (70.8 kg)  04/21/17 155 lb 12 oz (70.6 kg)  03/27/17 155 lb 8 oz (70.5 kg)    Physical Exam  Constitutional: She is oriented to person, place, and time. She appears well-developed and well-nourished.  HENT:  Head: Normocephalic and atraumatic.  Right Ear: Hearing, tympanic membrane, external ear and ear canal normal.  Left Ear: Hearing, tympanic membrane, external ear and ear canal normal.  Nose: Rhinorrhea present. Right sinus exhibits maxillary sinus tenderness. Left sinus exhibits maxillary sinus tenderness.  Mouth/Throat: Uvula is midline and oropharynx is clear and moist. No oropharyngeal exudate.  Neck: Neck supple.  Cardiovascular: Normal rate and regular rhythm.   Pulmonary/Chest: Effort normal and breath sounds normal. She has no wheezes.  Abdominal: Soft. Bowel sounds are normal. She exhibits no mass. There is no hepatosplenomegaly. There is no tenderness.  Musculoskeletal: She exhibits no  edema.  Lymphadenopathy:    She has no cervical adenopathy.  Neurological: She is alert and oriented to person, place, and time.  Skin: Skin is warm and dry.  Psychiatric: She has a normal mood and affect. Her behavior is normal.  Vitals reviewed.       Assessment & Plan:   Encounter Diagnoses  Name Primary?  . Essential hypertension Yes  . Hypothyroidism, unspecified type   . Hyperlipidemia, unspecified hyperlipidemia type   . Mammogram declined   . Subacute sinusitis, unspecified location     -pt to get fasting labs drawn this week or next.  We will call her with results -discussed screening mammogram guidelines but Pt declined mammogram stating she didn't want the pain -rx septra ds for sinus due to pcn allergy and financial limitations for other antibiotics -pt to Follow up 3 months.  RTO sooner prn

## 2017-10-23 ENCOUNTER — Ambulatory Visit: Payer: Self-pay | Admitting: Physician Assistant

## 2017-10-23 ENCOUNTER — Encounter: Payer: Self-pay | Admitting: Physician Assistant

## 2017-10-23 VITALS — BP 139/74 | HR 90 | Temp 97.9°F | Wt 157.5 lb

## 2017-10-23 DIAGNOSIS — J069 Acute upper respiratory infection, unspecified: Secondary | ICD-10-CM

## 2017-10-23 MED ORDER — BENZONATATE 100 MG PO CAPS: ORAL_CAPSULE | ORAL | 2 refills | Status: DC

## 2017-10-23 MED ORDER — AZITHROMYCIN 250 MG PO TABS: ORAL_TABLET | ORAL | 0 refills | Status: DC

## 2017-10-23 NOTE — Progress Notes (Signed)
BP 139/74 (BP Location: Right Arm, Patient Position: Sitting, Cuff Size: Normal)   Pulse 90   Temp 97.9 F (36.6 C)   Wt 157 lb 8 oz (71.4 kg)   SpO2 95%   BMI 29.76 kg/m    Subjective:    Patient ID: Emily Clark, female    DOB: August 03, 1961, 56 y.o.   MRN: [id number]  HPI: Emily Clark is a 56 y.o. female presenting on 10/23/2017 for Nasal Congestion (since saturday. sore throat, cough, hoarse, sneezing, HA, yellow and green mucous, sub fever. pt states she has taken tylenol sinus, ginger tea)   HPI   Chief Complaint  Patient presents with  . Nasal Congestion    since saturday. sore throat, cough, hoarse, sneezing, HA, yellow and green mucous, sub fever. pt states she has taken tylenol sinus, ginger tea    Pt has still not gotten labs drawn yet from October.  Pt states delsym 12 hour works well.     Relevant past medical, surgical, family and social history reviewed and updated as indicated. Interim medical history since our last visit reviewed. Allergies and medications reviewed and updated.   Current Outpatient Medications:  .  atenolol (TENORMIN) 25 MG tablet, Take 1 tablet (25 mg total) by mouth daily., Disp: 30 tablet, Rfl: 1 .  Flaxseed, Linseed, (FLAX SEED OIL PO), Take by mouth every other day., Disp: , Rfl:  .  levothyroxine (SYNTHROID, LEVOTHROID) 100 MCG tablet, Take 1 tablet (100 mcg total) by mouth daily., Disp: 30 tablet, Rfl: 1 .  Omega-3 Fatty Acids (FISH OIL) 1000 MG CAPS, Take by mouth., Disp: , Rfl:    Review of Systems  Constitutional: Positive for appetite change and fever (subjective only).  HENT: Positive for congestion, sneezing, sore throat and voice change.   Respiratory: Positive for cough. Negative for wheezing.   Cardiovascular: Negative for chest pain.  Gastrointestinal: Negative for abdominal pain.    Per HPI unless specifically indicated above     Objective:    BP 139/74 (BP Location: Right Arm, Patient Position: Sitting,  Cuff Size: Normal)   Pulse 90   Temp 97.9 F (36.6 C)   Wt 157 lb 8 oz (71.4 kg)   SpO2 95%   BMI 29.76 kg/m   Wt Readings from Last 3 Encounters:  10/23/17 157 lb 8 oz (71.4 kg)  09/08/17 156 lb (70.8 kg)  04/21/17 155 lb 12 oz (70.6 kg)    Physical Exam  Constitutional: She is oriented to person, place, and time. She appears well-developed and well-nourished.  HENT:  Head: Normocephalic and atraumatic.  Right Ear: Hearing, tympanic membrane, external ear and ear canal normal.  Left Ear: Hearing, tympanic membrane, external ear and ear canal normal.  Nose: Rhinorrhea present.  Mouth/Throat: Uvula is midline and oropharynx is clear and moist. No oropharyngeal exudate.  + nasal congestion  Neck: Neck supple.  Cardiovascular: Normal rate and regular rhythm.  Pulmonary/Chest: Effort normal and breath sounds normal. She has no wheezes.  Lymphadenopathy:    She has no cervical adenopathy.  Neurological: She is alert and oriented to person, place, and time.  Skin: Skin is warm and dry.  Psychiatric: She has a normal mood and affect. Her behavior is normal.  Vitals reviewed.       Assessment & Plan:    Encounter Diagnosis  Name Primary?  Marland Kitchen Upper respiratory tract infection, unspecified type Yes    -discussed with pt the her condition is most likely viral.  She is very unhappy with that and insists she needs antibiotic -pt is given reading information on URI and is urged to read it to learn more about viral URI -counseled rest, fluids -pt reminded she needs to get labs drawn -follow up as scheduled. RTO sooner pnr

## 2017-10-23 NOTE — Patient Instructions (Signed)
Upper Respiratory Infection, Adult Most upper respiratory infections (URIs) are a viral infection of the air passages leading to the lungs. A URI affects the nose, throat, and upper air passages. The most common type of URI is nasopharyngitis and is typically referred to as "the common cold." URIs run their course and usually go away on their own. Most of the time, a URI does not require medical attention, but sometimes a bacterial infection in the upper airways can follow a viral infection. This is called a secondary infection. Sinus and middle ear infections are common types of secondary upper respiratory infections. Bacterial pneumonia can also complicate a URI. A URI can worsen asthma and chronic obstructive pulmonary disease (COPD). Sometimes, these complications can require emergency medical care and may be life threatening. What are the causes? Almost all URIs are caused by viruses. A virus is a type of germ and can spread from one person to another. What increases the risk? You may be at risk for a URI if:  You smoke.  You have chronic heart or lung disease.  You have a weakened defense (immune) system.  You are very young or very old.  You have nasal allergies or asthma.  You work in crowded or poorly ventilated areas.  You work in health care facilities or schools.  What are the signs or symptoms? Symptoms typically develop 2-3 days after you come in contact with a cold virus. Most viral URIs last 7-10 days. However, viral URIs from the influenza virus (flu virus) can last 14-18 days and are typically more severe. Symptoms may include:  Runny or stuffy (congested) nose.  Sneezing.  Cough.  Sore throat.  Headache.  Fatigue.  Fever.  Loss of appetite.  Pain in your forehead, behind your eyes, and over your cheekbones (sinus pain).  Muscle aches.  How is this diagnosed? Your health care provider may diagnose a URI by:  Physical exam.  Tests to check that your  symptoms are not due to another condition such as: ? Strep throat. ? Sinusitis. ? Pneumonia. ? Asthma.  How is this treated? A URI goes away on its own with time. It cannot be cured with medicines, but medicines may be prescribed or recommended to relieve symptoms. Medicines may help:  Reduce your fever.  Reduce your cough.  Relieve nasal congestion.  Follow these instructions at home:  Take medicines only as directed by your health care provider.  Gargle warm saltwater or take cough drops to comfort your throat as directed by your health care provider.  Use a warm mist humidifier or inhale steam from a shower to increase air moisture. This may make it easier to breathe.  Drink enough fluid to keep your urine clear or pale yellow.  Eat soups and other clear broths and maintain good nutrition.  Rest as needed.  Return to work when your temperature has returned to normal or as your health care provider advises. You may need to stay home longer to avoid infecting others. You can also use a face mask and careful hand washing to prevent spread of the virus.  Increase the usage of your inhaler if you have asthma.  Do not use any tobacco products, including cigarettes, chewing tobacco, or electronic cigarettes. If you need help quitting, ask your health care provider. How is this prevented? The best way to protect yourself from getting a cold is to practice good hygiene.  Avoid oral or hand contact with people with cold symptoms.  Wash your   hands often if contact occurs.  There is no clear evidence that vitamin C, vitamin E, echinacea, or exercise reduces the chance of developing a cold. However, it is always recommended to get plenty of rest, exercise, and practice good nutrition. Contact a health care provider if:  You are getting worse rather than better.  Your symptoms are not controlled by medicine.  You have chills.  You have worsening shortness of breath.  You have  brown or red mucus.  You have yellow or brown nasal discharge.  You have pain in your face, especially when you bend forward.  You have a fever.  You have swollen neck glands.  You have pain while swallowing.  You have white areas in the back of your throat. Get help right away if:  You have severe or persistent: ? Headache. ? Ear pain. ? Sinus pain. ? Chest pain.  You have chronic lung disease and any of the following: ? Wheezing. ? Prolonged cough. ? Coughing up blood. ? A change in your usual mucus.  You have a stiff neck.  You have changes in your: ? Vision. ? Hearing. ? Thinking. ? Mood. This information is not intended to replace advice given to you by your health care provider. Make sure you discuss any questions you have with your health care provider. Document Released: 04/23/2001 Document Revised: 06/30/2016 Document Reviewed: 02/02/2014 Elsevier Interactive Patient Education  2017 [employer] Inc[employer]  

## 2017-10-24 ENCOUNTER — Other Ambulatory Visit (HOSPITAL_COMMUNITY)
Admission: RE | Admit: 2017-10-24 | Discharge: 2017-10-24 | Disposition: A | Discharge status: Home / Self Care | Payer: Self-pay | Source: Ambulatory Visit | Attending: Physician Assistant | Admitting: Physician Assistant

## 2017-10-24 DIAGNOSIS — E785 Hyperlipidemia, unspecified: Secondary | ICD-10-CM | POA: Insufficient documentation

## 2017-10-24 DIAGNOSIS — I10 Essential (primary) hypertension: Secondary | ICD-10-CM | POA: Insufficient documentation

## 2017-10-24 DIAGNOSIS — E039 Hypothyroidism, unspecified: Secondary | ICD-10-CM | POA: Insufficient documentation

## 2017-10-24 LAB — COMPREHENSIVE METABOLIC PANEL
ALK PHOS: 106 U/L (ref 38–126)
ALT: 41 U/L (ref 14–54)
ANION GAP: 8 (ref 5–15)
AST: 40 U/L (ref 15–41)
Albumin: 3.9 g/dL (ref 3.5–5.0)
BUN: 12 mg/dL (ref 6–20)
CALCIUM: 9.2 mg/dL (ref 8.9–10.3)
CO2: 26 mmol/L (ref 22–32)
CREATININE: 0.64 mg/dL (ref 0.44–1.00)
Chloride: 105 mmol/L (ref 101–111)
Glucose, Bld: 117 mg/dL — ABNORMAL HIGH (ref 65–99)
Potassium: 3.9 mmol/L (ref 3.5–5.1)
SODIUM: 139 mmol/L (ref 135–145)
Total Bilirubin: 0.6 mg/dL (ref 0.3–1.2)
Total Protein: 7.4 g/dL (ref 6.5–8.1)

## 2017-10-24 LAB — LIPID PANEL
Cholesterol: 246 mg/dL — ABNORMAL HIGH (ref 0–200)
HDL: 39 mg/dL — ABNORMAL LOW (ref >40)
LDL Cholesterol: 155 mg/dL — ABNORMAL HIGH (ref 0–99)
Total CHOL/HDL Ratio: 6.3 RATIO
Triglycerides: 260 mg/dL — ABNORMAL HIGH (ref <150)
VLDL: 52 mg/dL — ABNORMAL HIGH (ref 0–40)

## 2017-10-24 LAB — TSH: TSH: 3.35 u[IU]/mL (ref 0.350–4.500)

## 2017-11-13 ENCOUNTER — Other Ambulatory Visit: Payer: Self-pay | Admitting: Physician Assistant

## 2017-12-09 ENCOUNTER — Ambulatory Visit: Payer: Self-pay | Admitting: Physician Assistant

## 2017-12-09 ENCOUNTER — Encounter: Payer: Self-pay | Admitting: Physician Assistant

## 2017-12-09 VITALS — BP 133/77 | HR 93 | Temp 97.1°F | Ht 61.0 in | Wt 157.0 lb

## 2017-12-09 DIAGNOSIS — E039 Hypothyroidism, unspecified: Secondary | ICD-10-CM

## 2017-12-09 DIAGNOSIS — E785 Hyperlipidemia, unspecified: Secondary | ICD-10-CM

## 2017-12-09 DIAGNOSIS — Z5320 Procedure and treatment not carried out because of patient's decision for unspecified reasons: Secondary | ICD-10-CM

## 2017-12-09 DIAGNOSIS — I10 Essential (primary) hypertension: Secondary | ICD-10-CM

## 2017-12-09 NOTE — Progress Notes (Signed)
BP 133/77 (BP Location: Right Arm, Patient Position: Sitting, Cuff Size: Normal)   Pulse 93   Temp (!) 97.1 F (36.2 C) (Other (Comment))   Ht 5\' 1"  (1.549 m)   Wt 157 lb (71.2 kg)   SpO2 98%   BMI 29.66 kg/m    Subjective:    Patient ID: Emily Clark, female    DOB: 1961/05/19, 57 y.o.   MRN: [id number]  HPI: Emily Clark is a 57 y.o. female presenting on 12/09/2017 for Hypertension; Hyperlipidemia; and Thyroid Problem   HPI   Pt still working at [employer] but her hours have been cut  Pt not taking her fish oil.  She says it's too expensive  Relevant past medical, surgical, family and social history reviewed and updated as indicated. Interim medical history since our last visit reviewed. Allergies and medications reviewed and updated.   Current Outpatient Medications:  .  atenolol (TENORMIN) 25 MG tablet, TAKE 1 TABLET BY MOUTH ONCE DAILY, Disp: 30 tablet, Rfl: 1 .  Capsicum, Cayenne, (CAYENNE PO), Take 2 capsules by mouth at bedtime., Disp: , Rfl:  .  Flaxseed, Linseed, (FLAX SEED OIL PO), Take by mouth every other day., Disp: , Rfl:  .  levothyroxine (SYNTHROID, LEVOTHROID) 100 MCG tablet, Take 1 tablet (100 mcg total) by mouth daily., Disp: 30 tablet, Rfl: 1 .  Omega-3 Fatty Acids (FISH OIL) 1000 MG CAPS, Take by mouth., Disp: , Rfl:   Review of Systems  Constitutional: Negative for appetite change, chills, diaphoresis, fatigue, fever and unexpected weight change.  HENT: Negative for congestion, dental problem, drooling, ear pain, facial swelling, hearing loss, mouth sores, sneezing, sore throat, trouble swallowing and voice change.   Eyes: Negative for pain, discharge, redness, itching and visual disturbance.  Respiratory: Negative for cough, choking, shortness of breath and wheezing.   Cardiovascular: Negative for chest pain, palpitations and leg swelling.  Gastrointestinal: Negative for abdominal pain, blood in stool, constipation, diarrhea and vomiting.   Endocrine: Negative for cold intolerance, heat intolerance and polydipsia.  Genitourinary: Negative for decreased urine volume, dysuria and hematuria.  Musculoskeletal: Negative for arthralgias, back pain and gait problem.  Skin: Negative for rash.  Allergic/Immunologic: Negative for environmental allergies.  Neurological: Negative for seizures, syncope, light-headedness and headaches.  Hematological: Negative for adenopathy.  Psychiatric/Behavioral: Negative for agitation, dysphoric mood and suicidal ideas. The patient is not nervous/anxious.     Per HPI unless specifically indicated above     Objective:    BP 133/77 (BP Location: Right Arm, Patient Position: Sitting, Cuff Size: Normal)   Pulse 93   Temp (!) 97.1 F (36.2 C) (Other (Comment))   Ht 5\' 1"  (1.549 m)   Wt 157 lb (71.2 kg)   SpO2 98%   BMI 29.66 kg/m   Wt Readings from Last 3 Encounters:  12/09/17 157 lb (71.2 kg)  10/23/17 157 lb 8 oz (71.4 kg)  09/08/17 156 lb (70.8 kg)    Physical Exam  Constitutional: She is oriented to person, place, and time. She appears well-developed and well-nourished.  HENT:  Head: Normocephalic and atraumatic.  Neck: Neck supple.  Cardiovascular: Normal rate and regular rhythm.  Pulmonary/Chest: Effort normal and breath sounds normal.  Abdominal: Soft. Bowel sounds are normal. She exhibits no mass. There is no hepatosplenomegaly. There is no tenderness.  Musculoskeletal: She exhibits no edema.  Lymphadenopathy:    She has no cervical adenopathy.  Neurological: She is alert and oriented to person, place, and time.  Skin: Skin  is warm and dry.  Psychiatric: She has a normal mood and affect. Her behavior is normal.  Vitals reviewed.   Results for orders placed or performed during the hospital encounter of 10/24/17  Comprehensive metabolic panel  Result Value Ref Range   Sodium 139 135 - 145 mmol/L   Potassium 3.9 3.5 - 5.1 mmol/L   Chloride 105 101 - 111 mmol/L   CO2 26 22 -  32 mmol/L   Glucose, Bld 117 (H) 65 - 99 mg/dL   BUN 12 6 - 20 mg/dL   Creatinine, Ser 0.64 0.44 - 1.00 mg/dL   Calcium 9.2 8.9 - 10.3 mg/dL   Total Protein 7.4 6.5 - 8.1 g/dL   Albumin 3.9 3.5 - 5.0 g/dL   AST 40 15 - 41 U/L   ALT 41 14 - 54 U/L   Alkaline Phosphatase 106 38 - 126 U/L   Total Bilirubin 0.6 0.3 - 1.2 mg/dL   GFR calc non Af Amer >60 >60 mL/min   GFR calc Af Amer >60 >60 mL/min   Anion gap 8 5 - 15  TSH  Result Value Ref Range   TSH 3.350 0.350 - 4.500 uIU/mL  Lipid panel  Result Value Ref Range   Cholesterol 246 (H) 0 - 200 mg/dL   Triglycerides 260 (H) <150 mg/dL   HDL 39 (L) >40 mg/dL   Total CHOL/HDL Ratio 6.3 RATIO   VLDL 52 (H) 0 - 40 mg/dL   LDL Cholesterol 155 (H) 0 - 99 mg/dL      Assessment & Plan:   Encounter Diagnoses  Name Primary?  . Essential hypertension Yes  . Hypothyroidism, unspecified type   . Hyperlipidemia, unspecified hyperlipidemia type   . Mammogram declined     -reviewed labs with pt  -discussed lipids.  Pt refuses to take any rx medication for her lipids including statins and zetia.  Discussed that she should take the fish oil for her triglycerides.   When she says that it is expensive, discussed that she could save money by stopping the flax seed, otc potassium, and cayenne pepper that she is taking which isn't needed but is costing her dollars that she doesn't have.  For some reason, pt really wants to take these supplements which have no proven benefit and avoid taking medications which do have proven benefit.  Discussed with pt that lowfat diet and regular exercise are also ways to improve her lipids  -pt Declined mammogram  -pt to continue current levothyroxine and atenolol  -F/u 3 months.  RTO sooner prn

## 2017-12-09 NOTE — Patient Instructions (Signed)

## 2017-12-30 ENCOUNTER — Encounter: Payer: Self-pay | Admitting: Physician Assistant

## 2017-12-30 DIAGNOSIS — K635 Polyp of colon: Secondary | ICD-10-CM

## 2017-12-30 NOTE — Progress Notes (Signed)
While reviewing colonoscopy records that came from [place], report shows that pt had polyps that were not removed during the colonoscopy, but that the pt had been referred for endoscopic mucosal resection to have the polyps removed.  Pt was called and she says she never had a second procedure.   GI office in [place] was called and they confirmed that the pt had been referred for EMR but the insurance wouldn't accept the referral from the specialist and they say they sent report to the pt's PCP saying that she needed the referral but they have no report that the pt was ever sent for the procedure to remove the polyps.      Will refer pt to GI for colonic polyps

## 2018-01-05 ENCOUNTER — Encounter: Payer: Self-pay | Admitting: Internal Medicine

## 2018-01-09 ENCOUNTER — Other Ambulatory Visit: Payer: Self-pay | Admitting: Physician Assistant

## 2018-02-25 ENCOUNTER — Ambulatory Visit: Payer: Self-pay | Admitting: Gastroenterology

## 2018-03-10 ENCOUNTER — Ambulatory Visit: Payer: Self-pay | Admitting: Physician Assistant

## 2018-03-21 ENCOUNTER — Other Ambulatory Visit: Payer: Self-pay | Admitting: Physician Assistant

## 2018-04-01 ENCOUNTER — Ambulatory Visit: Payer: Self-pay | Admitting: Physician Assistant

## 2018-04-01 ENCOUNTER — Encounter: Payer: Self-pay | Admitting: Physician Assistant

## 2018-04-01 VITALS — BP 138/82 | HR 92 | Temp 97.9°F | Ht 61.0 in | Wt 158.0 lb

## 2018-04-01 DIAGNOSIS — K635 Polyp of colon: Secondary | ICD-10-CM

## 2018-04-01 DIAGNOSIS — I10 Essential (primary) hypertension: Secondary | ICD-10-CM

## 2018-04-01 DIAGNOSIS — E039 Hypothyroidism, unspecified: Secondary | ICD-10-CM

## 2018-04-01 DIAGNOSIS — Z5320 Procedure and treatment not carried out because of patient's decision for unspecified reasons: Secondary | ICD-10-CM

## 2018-04-01 MED ORDER — ATENOLOL 50 MG PO TABS: 50.0000 mg | ORAL_TABLET | Freq: Every day | ORAL | 3 refills | Status: DC

## 2018-04-01 NOTE — Progress Notes (Signed)
BP 138/82 (BP Location: Left Arm, Patient Position: Sitting, Cuff Size: Normal)   Pulse 92   Temp 97.9 F (36.6 C)   Ht 5\' 1"  (1.549 m)   Wt 158 lb (71.7 kg)   SpO2 96%   BMI 29.85 kg/m    Subjective:    Patient ID: Emily Clark, female    DOB: 07/16/1961, 57 y.o.   MRN: [id number]  HPI: Emily Clark is a 57 y.o. female presenting on 04/01/2018 for Follow-up   HPI   Pt still works at [employer]  She is happy she got a dog named zsa zsa.  Her daughter has brain cancer in [place] and son in [place] whom is isn't in contact with.   Pt did not fill out [facility] care application as discussed at last OV.  She cancelled her appt with GI for polyps  Relevant past medical, surgical, family and social history reviewed and updated as indicated. Interim medical history since our last visit reviewed. Allergies and medications reviewed and updated.   Current Outpatient Medications:  .  atenolol (TENORMIN) 25 MG tablet, TAKE 1 TABLET BY MOUTH ONCE DAILY, Disp: 10 tablet, Rfl: 0 .  Flaxseed, Linseed, (FLAX SEED OIL PO), Take by mouth every other day., Disp: , Rfl:  .  levothyroxine (SYNTHROID, LEVOTHROID) 100 MCG tablet, Take 1 tablet (100 mcg total) by mouth daily., Disp: 30 tablet, Rfl: 1 .  MELATONIN PO, Take 2 tablets by mouth at bedtime., Disp: , Rfl:  .  Omega-3 Fatty Acids (FISH OIL) 1000 MG CAPS, Take by mouth., Disp: , Rfl:  .  Capsicum, Cayenne, (CAYENNE PO), Take 2 capsules by mouth at bedtime., Disp: , Rfl:    Review of Systems  Constitutional: Negative for appetite change, chills, diaphoresis, fatigue, fever and unexpected weight change.  HENT: Negative for congestion, dental problem, drooling, ear pain, facial swelling, hearing loss, mouth sores, sneezing, sore throat, trouble swallowing and voice change.   Eyes: Negative for pain, discharge, redness, itching and visual disturbance.  Respiratory: Negative for cough, choking, shortness of breath and wheezing.    Cardiovascular: Negative for chest pain, palpitations and leg swelling.  Gastrointestinal: Negative for abdominal pain, blood in stool, constipation, diarrhea and vomiting.  Endocrine: Negative for cold intolerance, heat intolerance and polydipsia.  Genitourinary: Negative for decreased urine volume, dysuria and hematuria.  Musculoskeletal: Negative for arthralgias, back pain and gait problem.  Skin: Negative for rash.  Allergic/Immunologic: Negative for environmental allergies.  Neurological: Negative for seizures, syncope, light-headedness and headaches.  Hematological: Negative for adenopathy.  Psychiatric/Behavioral: Negative for agitation, dysphoric mood and suicidal ideas. The patient is not nervous/anxious.     Per HPI unless specifically indicated above     Objective:    BP 138/82 (BP Location: Left Arm, Patient Position: Sitting, Cuff Size: Normal)   Pulse 92   Temp 97.9 F (36.6 C)   Ht 5\' 1"  (1.549 m)   Wt 158 lb (71.7 kg)   SpO2 96%   BMI 29.85 kg/m   Wt Readings from Last 3 Encounters:  04/01/18 158 lb (71.7 kg)  12/09/17 157 lb (71.2 kg)  10/23/17 157 lb 8 oz (71.4 kg)    Physical Exam  Constitutional: She is oriented to person, place, and time. She appears well-developed and well-nourished.  HENT:  Head: Normocephalic and atraumatic.  Neck: Neck supple.  Cardiovascular: Normal rate and regular rhythm.  Pulmonary/Chest: Effort normal and breath sounds normal.  Abdominal: Soft. Bowel sounds are normal. She exhibits  no mass. There is no hepatosplenomegaly. There is no tenderness.  Musculoskeletal: She exhibits no edema.  Lymphadenopathy:    She has no cervical adenopathy.  Neurological: She is alert and oriented to person, place, and time.  Skin: Skin is warm and dry.  Psychiatric: She has a normal mood and affect. Her behavior is normal.  Vitals reviewed.        Assessment & Plan:   Encounter Diagnoses  Name Primary?  . Essential hypertension Yes   . Hypothyroidism, unspecified type   . Polyp of colon, unspecified part of colon, unspecified type   . Mammogram declined      -pt declined mammogram -Encouraged pt to submit  [facility] care application and reschedule appt with GI -pt to Get fasting labs done -Increase atenolol to better control blood pressure -Discussed f/u in 1 month to recheck bp but pt doesn't want to return until 3 months.  She can RTO

## 2018-04-03 ENCOUNTER — Other Ambulatory Visit (HOSPITAL_COMMUNITY)
Admission: RE | Admit: 2018-04-03 | Discharge: 2018-04-03 | Disposition: A | Discharge status: Home / Self Care | Payer: Self-pay | Source: Ambulatory Visit | Attending: Physician Assistant | Admitting: Physician Assistant

## 2018-04-03 DIAGNOSIS — E039 Hypothyroidism, unspecified: Secondary | ICD-10-CM | POA: Insufficient documentation

## 2018-04-03 DIAGNOSIS — I10 Essential (primary) hypertension: Secondary | ICD-10-CM | POA: Insufficient documentation

## 2018-04-03 DIAGNOSIS — E785 Hyperlipidemia, unspecified: Secondary | ICD-10-CM | POA: Insufficient documentation

## 2018-04-03 LAB — COMPREHENSIVE METABOLIC PANEL
ALT: 23 U/L (ref 14–54)
AST: 24 U/L (ref 15–41)
Albumin: 4.2 g/dL (ref 3.5–5.0)
Alkaline Phosphatase: 79 U/L (ref 38–126)
Anion gap: 9 (ref 5–15)
BUN: 12 mg/dL (ref 6–20)
CO2: 23 mmol/L (ref 22–32)
Calcium: 9.6 mg/dL (ref 8.9–10.3)
Chloride: 105 mmol/L (ref 101–111)
Creatinine, Ser: 1 mg/dL (ref 0.44–1.00)
GFR calc Af Amer: >60 mL/min (ref >60)
GFR calc non Af Amer: >60 mL/min (ref >60)
Glucose, Bld: 120 mg/dL — ABNORMAL HIGH (ref 65–99)
Potassium: 3.7 mmol/L (ref 3.5–5.1)
Sodium: 137 mmol/L (ref 135–145)
Total Bilirubin: 0.9 mg/dL (ref 0.3–1.2)
Total Protein: 7.5 g/dL (ref 6.5–8.1)

## 2018-04-03 LAB — LIPID PANEL
CHOL/HDL RATIO: 6.1 ratio
Cholesterol: 281 mg/dL — ABNORMAL HIGH (ref 0–200)
HDL: 46 mg/dL (ref >40)
LDL Cholesterol: 156 mg/dL — ABNORMAL HIGH (ref 0–99)
Triglycerides: 393 mg/dL — ABNORMAL HIGH (ref <150)
VLDL: 79 mg/dL — ABNORMAL HIGH (ref 0–40)

## 2018-04-03 LAB — TSH: TSH: 4.941 u[IU]/mL — AB (ref 0.350–4.500)

## 2018-04-07 ENCOUNTER — Ambulatory Visit: Payer: Self-pay | Admitting: Gastroenterology

## 2018-04-16 ENCOUNTER — Encounter: Payer: Self-pay | Admitting: Student

## 2018-06-03 ENCOUNTER — Encounter: Payer: Self-pay | Admitting: Physician Assistant

## 2018-06-03 ENCOUNTER — Ambulatory Visit: Payer: Self-pay | Admitting: Physician Assistant

## 2018-06-03 VITALS — BP 104/67 | HR 74 | Temp 97.9°F | Ht 61.0 in | Wt 154.0 lb

## 2018-06-03 DIAGNOSIS — R300 Dysuria: Secondary | ICD-10-CM

## 2018-06-03 LAB — POCT URINALYSIS DIPSTICK
Bilirubin, UA: NEGATIVE
Blood, UA: NEGATIVE
Glucose, UA: NEGATIVE
Ketones, UA: NEGATIVE
Nitrite, UA: NEGATIVE
PH UA: 6 (ref 5.0–8.0)
Protein, UA: NEGATIVE
Spec Grav, UA: <=1.005 — AB (ref 1.010–1.025)
UROBILINOGEN UA: 0.2 U/dL

## 2018-06-03 NOTE — Progress Notes (Signed)
BP 104/67 (BP Location: Right Arm, Patient Position: Sitting, Cuff Size: Normal)   Pulse 74   Temp 97.9 F (36.6 C)   Ht 5\' 1"  (1.549 m)   Wt 154 lb (69.9 kg)   SpO2 93%   BMI 29.10 kg/m    Subjective:    Patient ID: Emily Clark, female    DOB: May 27, 1961, 57 y.o.   MRN: [id number]  HPI: Emily Clark is a 57 y.o. female presenting on 06/03/2018 for Urinary Tract Infection   HPI   Pt c/o burning suprapubic and feels uncomfortable when when urinates.  She took azo for a while and it helped but then she stopped the azo and her symptoms returned.  She also complains of back pain.  She says her symptoms started a couple weeks ago.   Nothing makes the back hurt worse or less.    Pt drinks tea mostly and almost never drinks water.    Relevant past medical, surgical, family and social history reviewed and updated as indicated. Interim medical history since our last visit reviewed. Allergies and medications reviewed and updated.   Current Outpatient Medications:  .  atenolol (TENORMIN) 50 MG tablet, Take 1 tablet (50 mg total) by mouth daily., Disp: 30 tablet, Rfl: 3 .  b complex vitamins tablet, Take 1 tablet by mouth daily., Disp: , Rfl:  .  levothyroxine (SYNTHROID, LEVOTHROID) 100 MCG tablet, Take 1 tablet (100 mcg total) by mouth daily., Disp: 30 tablet, Rfl: 1 .  MELATONIN PO, Take 2 tablets by mouth at bedtime as needed. , Disp: , Rfl:  .  Multiple Vitamin (MULTIVITAMIN) tablet, Take 1 tablet by mouth daily., Disp: , Rfl:  .  Omega 3-6-9 Fatty Acids (TRIPLE OMEGA COMPLEX PO), Take 1 capsule by mouth 3 (three) times daily., Disp: , Rfl:    Review of Systems  Constitutional: Negative for appetite change, chills, diaphoresis, fatigue, fever and unexpected weight change.  HENT: Negative for congestion, dental problem, drooling, ear pain, facial swelling, hearing loss, mouth sores, sneezing, sore throat, trouble swallowing and voice change.   Eyes: Negative for pain,  discharge, redness, itching and visual disturbance.  Respiratory: Negative for cough, choking, shortness of breath and wheezing.   Cardiovascular: Negative for chest pain, palpitations and leg swelling.  Gastrointestinal: Negative for abdominal pain, blood in stool, constipation, diarrhea and vomiting.  Endocrine: Negative for cold intolerance, heat intolerance and polydipsia.  Genitourinary: Positive for dysuria. Negative for decreased urine volume and hematuria.  Musculoskeletal: Negative for arthralgias, back pain and gait problem.  Skin: Negative for rash.  Allergic/Immunologic: Negative for environmental allergies.  Neurological: Negative for seizures, syncope, light-headedness and headaches.  Hematological: Negative for adenopathy.  Psychiatric/Behavioral: Negative for agitation, dysphoric mood and suicidal ideas. The patient is not nervous/anxious.     Per HPI unless specifically indicated above     Objective:    BP 104/67 (BP Location: Right Arm, Patient Position: Sitting, Cuff Size: Normal)   Pulse 74   Temp 97.9 F (36.6 C)   Ht 5\' 1"  (1.549 m)   Wt 154 lb (69.9 kg)   SpO2 93%   BMI 29.10 kg/m   Wt Readings from Last 3 Encounters:  06/03/18 154 lb (69.9 kg)  04/01/18 158 lb (71.7 kg)  12/09/17 157 lb (71.2 kg)    Physical Exam  Constitutional: She is oriented to person, place, and time. She appears well-developed and well-nourished.  HENT:  Head: Normocephalic and atraumatic.  Neck: Neck supple.  Cardiovascular:  Normal rate and regular rhythm.  Pulmonary/Chest: Effort normal and breath sounds normal.  Abdominal: Soft. Bowel sounds are normal. She exhibits no mass. There is no hepatosplenomegaly. There is no tenderness. There is no rigidity, no rebound, no guarding and no CVA tenderness.  Musculoskeletal: She exhibits no edema.  Lymphadenopathy:    She has no cervical adenopathy.  Neurological: She is alert and oriented to person, place, and time.  Skin: Skin is  warm and dry.  Psychiatric: She has a normal mood and affect. Her behavior is normal.  Vitals reviewed.   Results for orders placed or performed in visit on 06/03/18  POCT Urinalysis Dipstick  Result Value Ref Range   Color, UA YELLOW    Clarity, UA CLOUDY    Glucose, UA Negative Negative   Bilirubin, UA NEG    Ketones, UA NEG    Spec Grav, UA <=1.005 (A) 1.010 - 1.025   Blood, UA NEG    pH, UA 6.0 5.0 - 8.0   Protein, UA Negative Negative   Urobilinogen, UA 0.2 0.2 or 1.0 E.U./dL   Nitrite, UA NEG    Leukocytes, UA Trace (A) Negative   Appearance     Odor        Assessment & Plan:   Encounter Diagnosis  Name Primary?  . Dysuria Yes    -Urinalysis unremarkable.  Will send for culture -counseled pt to increase water -pt to follow up as scheduled.  RTO soonere prn

## 2018-06-04 ENCOUNTER — Other Ambulatory Visit (HOSPITAL_COMMUNITY)
Admission: RE | Admit: 2018-06-04 | Discharge: 2018-06-04 | Disposition: A | Discharge status: Home / Self Care | Payer: Self-pay | Source: Other Acute Inpatient Hospital | Attending: Physician Assistant | Admitting: Physician Assistant

## 2018-06-04 DIAGNOSIS — R300 Dysuria: Secondary | ICD-10-CM | POA: Insufficient documentation

## 2018-06-06 LAB — URINE CULTURE: Culture: >=100000 — AB

## 2018-06-10 ENCOUNTER — Other Ambulatory Visit: Payer: Self-pay | Admitting: Physician Assistant

## 2018-06-10 MED ORDER — SULFAMETHOXAZOLE-TRIMETHOPRIM 800-160 MG PO TABS: 1.0000 | ORAL_TABLET | Freq: Two times a day (BID) | ORAL | 0 refills | Status: AC

## 2018-06-17 ENCOUNTER — Other Ambulatory Visit: Payer: Self-pay | Admitting: Physician Assistant

## 2018-06-18 ENCOUNTER — Other Ambulatory Visit: Payer: Self-pay | Admitting: Physician Assistant

## 2018-06-18 MED ORDER — ATENOLOL 50 MG PO TABS: 50.0000 mg | ORAL_TABLET | Freq: Every day | ORAL | 0 refills | Status: DC

## 2018-07-02 ENCOUNTER — Ambulatory Visit: Payer: Self-pay | Admitting: Physician Assistant

## 2019-01-13 ENCOUNTER — Other Ambulatory Visit (HOSPITAL_COMMUNITY): Payer: Self-pay | Admitting: Family

## 2019-01-13 DIAGNOSIS — Z1231 Encounter for screening mammogram for malignant neoplasm of breast: Secondary | ICD-10-CM

## 2019-01-25 ENCOUNTER — Ambulatory Visit (HOSPITAL_COMMUNITY)
Admission: RE | Admit: 2019-01-25 | Discharge: 2019-01-25 | Disposition: A | Discharge status: Home / Self Care | Payer: Self-pay | Source: Ambulatory Visit | Attending: Family | Admitting: Family

## 2019-01-25 ENCOUNTER — Other Ambulatory Visit: Payer: Self-pay

## 2019-01-25 DIAGNOSIS — Z1231 Encounter for screening mammogram for malignant neoplasm of breast: Secondary | ICD-10-CM

## 2019-02-01 ENCOUNTER — Other Ambulatory Visit (HOSPITAL_COMMUNITY): Payer: Self-pay | Admitting: Family

## 2019-02-01 DIAGNOSIS — R921 Mammographic calcification found on diagnostic imaging of breast: Secondary | ICD-10-CM

## 2019-02-02 ENCOUNTER — Ambulatory Visit (HOSPITAL_COMMUNITY)
Admission: RE | Admit: 2019-02-02 | Discharge: 2019-02-02 | Disposition: A | Discharge status: Home / Self Care | Payer: Self-pay | Source: Ambulatory Visit | Attending: Family | Admitting: Family

## 2019-02-02 ENCOUNTER — Other Ambulatory Visit: Payer: Self-pay

## 2019-02-02 DIAGNOSIS — R921 Mammographic calcification found on diagnostic imaging of breast: Secondary | ICD-10-CM | POA: Insufficient documentation

## 2019-02-10 ENCOUNTER — Other Ambulatory Visit (HOSPITAL_COMMUNITY): Payer: Self-pay | Admitting: *Deleted

## 2019-02-10 ENCOUNTER — Other Ambulatory Visit (HOSPITAL_COMMUNITY): Payer: Self-pay | Admitting: Obstetrics and Gynecology

## 2019-02-10 DIAGNOSIS — R921 Mammographic calcification found on diagnostic imaging of breast: Secondary | ICD-10-CM

## 2019-02-17 ENCOUNTER — Telehealth (HOSPITAL_COMMUNITY): Payer: Self-pay | Admitting: *Deleted

## 2019-02-17 NOTE — Telephone Encounter (Signed)
Telephoned patient at home number and confirmed [facility] April 9. No symptoms of COVID-19. No contact with someone with a confirmed diagnosis of COVID-19. No travel outside [place] is the past 14 days.

## 2019-02-18 ENCOUNTER — Encounter (HOSPITAL_COMMUNITY): Payer: Self-pay

## 2019-02-18 ENCOUNTER — Ambulatory Visit
Admission: RE | Admit: 2019-02-18 | Discharge: 2019-02-18 | Disposition: A | Discharge status: Home / Self Care | Payer: No Typology Code available for payment source | Source: Ambulatory Visit | Attending: Obstetrics and Gynecology | Admitting: Obstetrics and Gynecology

## 2019-02-18 ENCOUNTER — Ambulatory Visit (HOSPITAL_COMMUNITY)
Admission: RE | Admit: 2019-02-18 | Discharge: 2019-02-18 | Disposition: A | Discharge status: Home / Self Care | Payer: Self-pay | Source: Ambulatory Visit | Attending: Obstetrics and Gynecology | Admitting: Obstetrics and Gynecology

## 2019-02-18 ENCOUNTER — Other Ambulatory Visit: Payer: Self-pay

## 2019-02-18 VITALS — BP 142/96 | Temp 98.0°F | Wt 153.0 lb

## 2019-02-18 DIAGNOSIS — Z1239 Encounter for other screening for malignant neoplasm of breast: Secondary | ICD-10-CM

## 2019-02-18 DIAGNOSIS — R921 Mammographic calcification found on diagnostic imaging of breast: Secondary | ICD-10-CM

## 2019-02-18 DIAGNOSIS — N644 Mastodynia: Secondary | ICD-10-CM

## 2019-02-18 NOTE — Progress Notes (Signed)
Patient referred to [facility] by the [facility] due to recommending a left breast biopsy. Screening mammogram completed 01/25/2019 that additional imaging of the left breast recommended. Left breast diagnostic mammogram completed 02/02/2019 that a left breast biopsy is recommended.  Pap Smear: Pap smear not completed today. Last Pap smear was 05/21/2016 at Dr. Jane Canary office in [place], [place] and normal. Per patient has a history of an abnormal Pap smear in the late 1990's that a colposcopy was completed for follow-up. Patient stated she has had more than three normal Pap smears since colposcopy. Last Pap smear result is in Epic.  Physical exam: Breasts Breasts symmetrical. No skin abnormalities bilateral breasts. No nipple retraction bilateral breasts. No nipple discharge bilateral breasts. No lymphadenopathy. No lumps palpated bilateral breasts. No complaints of pain or tenderness on exam. Referred patient to the [facility] for a left breast biopsy per recommendation. Appointment scheduled for Thursday, February 18, 2019 at 1245.        Pelvic/Bimanual No Pap smear completed today since last Pap smear was 05/21/2016. Pap smear not indicated per [facility] guidelines.   Smoking History: Patient is a former smoker that quit in 2016.  Patient Navigation: Patient education provided. Access to services provided for patient through [facility] program.    Colorectal Cancer Screening: Per patient had a colonoscopy completed in 2016. No complaints today.   Breast and Cervical Cancer Risk Assessment: Patient has a family history of a paternal aunt having breast cancer. Patient has no known genetic mutations or history of radiation treatment to the chest before age 30. Per patient has a history of cervical dysplasia. Patient has no history of being immunocompromised or DES exposure in-utero.  Risk Assessment    Risk Scores      02/18/2019   Last edited by: Armond Hang, LPN   5-year risk: 1.1 %   Lifetime risk: 6.3 %

## 2019-02-18 NOTE — Patient Instructions (Signed)
Explained breast self awareness with Caryl Asp. Patient did not need a Pap smear today due to last Pap smear was 05/21/2016. Let her know [facility] will cover Pap smears every 3 years unless has a history of abnormal Pap smears. Referred patient to the [facility] for a left breast biopsy per recommendation. Appointment scheduled for Thursday, February 18, 2019 at 1245. Patient aware of appointment and will be there. Caryl Asp verbalized understanding.  Maguire Sime, Arvil Chaco, RN 12:06 PM

## 2019-02-19 ENCOUNTER — Other Ambulatory Visit: Payer: Self-pay | Admitting: Obstetrics and Gynecology

## 2019-02-19 DIAGNOSIS — R921 Mammographic calcification found on diagnostic imaging of breast: Secondary | ICD-10-CM

## 2019-02-24 ENCOUNTER — Telehealth (HOSPITAL_COMMUNITY): Payer: Self-pay | Admitting: *Deleted

## 2019-02-24 NOTE — Telephone Encounter (Signed)
Telephoned patient at home number and left message to return call to [facility] to fill out [facility] Medicaid paperwork.

## 2019-03-02 ENCOUNTER — Ambulatory Visit: Payer: Self-pay | Admitting: General Surgery

## 2019-03-02 DIAGNOSIS — N6092 Unspecified benign mammary dysplasia of left breast: Secondary | ICD-10-CM

## 2019-03-05 ENCOUNTER — Other Ambulatory Visit: Payer: Self-pay | Admitting: Obstetrics and Gynecology

## 2019-03-05 ENCOUNTER — Ambulatory Visit
Admission: RE | Admit: 2019-03-05 | Discharge: 2019-03-05 | Disposition: A | Discharge status: Home / Self Care | Payer: No Typology Code available for payment source | Source: Ambulatory Visit | Attending: Obstetrics and Gynecology | Admitting: Obstetrics and Gynecology

## 2019-03-05 ENCOUNTER — Other Ambulatory Visit: Payer: Self-pay

## 2019-03-05 DIAGNOSIS — R921 Mammographic calcification found on diagnostic imaging of breast: Secondary | ICD-10-CM

## 2019-03-16 ENCOUNTER — Telehealth: Payer: Self-pay | Admitting: Oncology

## 2019-03-16 NOTE — Telephone Encounter (Signed)
Received a referral from Dr. Marlou Starks for MS. Gange to be scheduled in the high risk breast clinic. I cld and scheduled MS. Kolenovic to see Dr. Jana Hakim on 5/11 at [place]. She has been made aware to arrive 15 minutes early.

## 2019-03-18 NOTE — Progress Notes (Signed)
[place]  [phone] [phone]     ID: Emily Clark DOB: 08-30-1961  MR#: [id number]  FBP#:[id number]  Patient Care Team: Wyatt Haste, NP as PCP - General Rourk, Cristopher Estimable, MD as Consulting Physician (Gastroenterology) Magrinat, Virgie Dad, MD as Consulting Physician (Oncology) Jovita Kussmaul, MD as Consulting Physician (General Surgery) Chauncey Cruel, MD OTHER MD:   CHIEF COMPLAINT: high risk for breast cancer  CURRENT TREATMENT: Intensified screening   HISTORY OF CURRENT ILLNESS: Emily Clark had routine screening mammography on 01/25/2019 showing calcifications in the left breast. She underwent left diagnostic mammography with tomography at [facility] on 02/02/2019 showing: breast density category C; indeterminate pleomorphic calcifications in the lateral inferior left breast spanning 5 mm.  Accordingly on 02/18/2019 she proceeded to biopsy of the left breast area in question. The pathology from this procedure ([id number]) showed: atypical ductal hyperplasia with calcifications. She was, however, unable to tolerate clip placement at this time and underwent clip placement on 03/05/2019.  The patient's subsequent history is as detailed below.   INTERVAL HISTORY: Emily Clark was evaluated in the high risk breast cancer clinic on 03/22/2019.    REVIEW OF SYSTEMS: Emily Clark reports doing well overall. She notes hot flashes. There were no specific symptoms leading to the original mammogram, which was routinely scheduled. The patient denies unusual headaches, visual changes, nausea, vomiting, stiff neck, dizziness, or gait imbalance. There has been no cough, phlegm production, or pleurisy, no chest pain or pressure, and no change in bowel or bladder habits. The patient denies fever, rash, bleeding, unexplained fatigue or unexplained weight loss. A detailed review of systems was otherwise entirely negative.   PAST MEDICAL HISTORY:  Past Medical History:  Diagnosis Date  . Hyperlipidemia   . Hypertension   . Thyroid disease     PAST SURGICAL HISTORY: Past Surgical History:  Procedure Laterality Date  . CHOLECYSTECTOMY    . finger reattachment Right    third  . HEMORRHOID SURGERY    . TUBAL LIGATION    . tummy tuck      FAMILY HISTORY Family History  Problem Relation Age of Onset  . Stroke Mother 73  . Diabetes Mother   . Heart disease Father   . Coronary artery disease Father    Patient's father was 69 years old when he died from CAD. Patient's mother died from stroke at age 59.  Emily Clark has no brothers, one sister.  The patient denies/or notes a family hx of breast or ovarian cancer. She has a paternal aunt, age 41, with breast and ovarian cancers.    GYNECOLOGIC HISTORY:  No LMP recorded. Patient is postmenopausal. Menarche: 58 years old Age at first live birth: 58 years old [place] P 2 LMP 2011 Contraceptive status post bilateral tubal ligation HRT took progesterone for heavy periods until about a month ago. Hysterectomy? no BSO? no   SOCIAL HISTORY: (updated 03/22/2019)  Emily Clark is currently working at [employer]. She is from [place] and recently moved back here from [place], [place]. She is divorced, but she is engaged and due to be married later this year. Fiance Wallie Renshaw is currently in a different state, taking care of his 85 year old mother..  The patient lives at home by herself. Daughter Jonelle Sidle lives in [place] and is disabled, recovering from brain cancer. Son Cheri Rous is currently incarcerated in [place].  The patient has little contact with her children or grandchildren.   ADVANCED DIRECTIVES:  Not in place.  The patient says if she became incapacitated we could ask her neighbor Levada Dy Sprague to act as her healthcare power of attorney.  She can be reached at [phone].  If and when she marries Emily Clark of course he can serve that function.  Currently he can be reached at [phone].    HEALTH MAINTENANCE: Social History   Tobacco Use  . Smoking status: Former Smoker    Packs/day: 0.50    Years: 41.00    Pack years: 20.50    Last attempt to quit: 09/06/2015    Years since quitting: 3.5  . Smokeless tobacco: Never Used  Substance Use Topics  . Alcohol use: Not Currently    Comment: rare social  . Drug use: No     Colonoscopy: 2016  PAP: 05/21/2016, normal  Bone density: never done   Allergies  Allergen Reactions  . Penicillins Other (See Comments)    Rapid heartrate  . Codeine Itching  . Tetracyclines & Related   . Amoxicillin Palpitations    Current Outpatient Medications  Medication Sig Dispense Refill  . b complex vitamins tablet Take 1 tablet by mouth daily.    . Cholecalciferol (D3 ADULT PO) Take by mouth.    . levothyroxine (SYNTHROID, LEVOTHROID) 100 MCG tablet Take 1 tablet (100 mcg total) by mouth daily. 30 tablet 1  . MELATONIN PO Take 2 tablets by mouth at bedtime as needed.     . metoprolol tartrate (LOPRESSOR) 25 MG tablet Take 25 mg by mouth 2 (two) times daily with a meal.    . Multiple Vitamin (MULTIVITAMIN) tablet Take 1 tablet by mouth daily.    Emily Clark 3-6-9 Fatty Acids (TRIPLE OMEGA COMPLEX PO) Take 1 capsule by mouth 3 (three) times daily.     No current facility-administered medications for this visit.     OBJECTIVE: Middle-aged white woman in no acute distress  Vitals:   03/22/19 1020  BP: (!) 148/87  Resp: 18  Temp: 98.5 F (36.9 C)  SpO2: 98%     Body mass index is 30.06 kg/m.   Wt Readings from Last 3 Encounters:  03/22/19 159 lb 1.6 oz (72.2 kg)  02/18/19 153 lb (69.4 kg)  06/03/18 154 lb (69.9 kg)      ECOG FS:1 - Symptomatic but completely ambulatory  Ocular: Sclerae unicteric, pupils round and equal Wearing a mask Lymphatic: No cervical or supraclavicular adenopathy Lungs no rales or rhonchi Heart regular rate and rhythm Abd soft, nontender, positive bowel sounds MSK no focal spinal tenderness, no  joint edema Neuro: non-focal, well-oriented, appropriate affect Breasts: I do not palpate a mass in either breast.  There are no skin or nipple changes of concern.  Both axillae are benign.  LAB RESULTS:  CMP     Component Value Date/Time   NA 137 04/03/2018 0849   K 3.7 04/03/2018 0849   CL 105 04/03/2018 0849   CO2 23 04/03/2018 0849   GLUCOSE 120 (H) 04/03/2018 0849   BUN 12 04/03/2018 0849   CREATININE 1.00 04/03/2018 0849   CREATININE 0.84 01/28/2017 1015   CALCIUM 9.6 04/03/2018 0849   PROT 7.5 04/03/2018 0849   ALBUMIN 4.2 04/03/2018 0849   AST 24 04/03/2018 0849   ALT 23 04/03/2018 0849   ALKPHOS 79 04/03/2018 0849   BILITOT 0.9 04/03/2018 0849   GFRNONAA >60 04/03/2018 0849   GFRAA >60 04/03/2018 0849    No results found for: TOTALPROTELP, ALBUMINELP, A1GS, A2GS, BETS, BETA2SER, [place], MSPIKE,  SPEI  No results found for: Nils Pyle, [facility]  Lab Results  Component Value Date   WBC 6.6 01/28/2017   NEUTROABS 3,168 01/28/2017   HGB 13.3 01/28/2017   HCT 39.1 01/28/2017   MCV 90.1 01/28/2017   PLT 346 01/28/2017    '@LASTCHEMISTRY' @  No results found for: LABCA2  No components found for: [id number]  No results for input(s): INR in the last 168 hours.  No results found for: LABCA2  No results found for: GMW102  No results found for: VOZ366  No results found for: YQI347  No results found for: CA2729  No components found for: HGQUANT  No results found for: CEA1 / No results found for: CEA1   No results found for: AFPTUMOR  No results found for: CHROMOGRNA  No results found for: PSA1  No visits with results within 3 Day(s) from this visit.  Latest known visit with results is:  Hospital Outpatient Visit on 06/04/2018  Component Date Value Ref Range Status  . Specimen Description 06/03/2018    Final                   Value:URINE, CLEAN CATCH Performed at [facility], [address] [place], [place] [address]   . Special  Requests 06/03/2018    Final                   Value:NONE Performed at [facility], [address], [place], [place] [address]   . Culture 06/03/2018 >=100,000 COLONIES/mL PROTEUS MIRABILIS*  Final  . Report Status 06/03/2018 06/06/2018 FINAL   Final  . Organism ID, Bacteria 06/03/2018 PROTEUS MIRABILIS*  Final    (this displays the last labs from the last 3 days)  No results found for: TOTALPROTELP, ALBUMINELP, A1GS, A2GS, BETS, BETA2SER, GAMS, MSPIKE, SPEI (this displays SPEP labs)  No results found for: KPAFRELGTCHN, LAMBDASER, KAPLAMBRATIO (kappa/lambda light chains)  No results found for: HGBA, HGBA2QUANT, HGBFQUANT, HGBSQUAN (Hemoglobinopathy evaluation)   No results found for: LDH  No results found for: IRON, TIBC, IRONPCTSAT (Iron and TIBC)  No results found for: FERRITIN  Urinalysis    Component Value Date/Time   BILIRUBINUR NEG 06/03/2018 1308   PROTEINUR Negative 06/03/2018 1308   UROBILINOGEN 0.2 06/03/2018 1308   NITRITE NEG 06/03/2018 1308   LEUKOCYTESUR Trace (A) 06/03/2018 1308     STUDIES: Mm Diag Breast Tomo Uni Left  Result Date: 03/05/2019 CLINICAL DATA:  Stereotactic biopsy was performed on 02/18/2019 for indeterminate calcifications in the lower outer quadrant of the LEFT breast. Pathology result revealed atypical ductal hyperplasia. During the stereotactic biopsy on 02/18/2019, patient jumped causing dislodgement of the biopsy device from the breast. This precluded clip placement at the biopsy site. Patient returns today for clip placement. Initial diagnostic mammogram performed to assess for residual calcifications at the biopsy site for clip localization. EXAM: DIGITAL DIAGNOSTIC UNILATERAL LEFT MAMMOGRAM WITH CAD AND TOMO COMPARISON:  Previous exam(s). ACR Breast Density Category c: The breast tissue is heterogeneously dense, which may obscure small masses. FINDINGS: There is a residual post biopsy hematoma within the lower outer quadrant of the LEFT  breast. At the anterior inferior margin of the hematoma, several residual punctate calcifications are identified for localization purposes. Mammographic images were processed with CAD. IMPRESSION: Residual post biopsy hematoma within the lower outer quadrant of the LEFT breast. At the anterior inferior margin of the hematoma, several residual punctate calcifications are identified for localization purposes. Biopsy clip will be placed at this site. RECOMMENDATION:  1. Biopsy clip placement will be performed today. 2. Per current treatment plan for patient's known ADH in the lower outer quadrant of the LEFT breast. I have discussed the findings and recommendations with the patient. Results were also provided in writing at the conclusion of the visit. If applicable, a reminder letter will be sent to the patient regarding the next appointment. BI-RADS CATEGORY  4: Suspicious. Known ADH within the lower outer quadrant of the LEFT breast which patient presents today for clip localization. Surgical excision recommended to exclude upgrade to DCIS or invasive carcinoma. Surgical consult has been arranged. No additional abnormality in the LEFT breast. Electronically Signed   By: Franki Cabot M.D.   On: 03/05/2019 11:37   Mm Clip Placement Left  Result Date: 03/05/2019 CLINICAL DATA:  Stereotactic biopsy performed on 02/18/2019 revealing ADH in the lower outer quadrant of the LEFT breast. Patient returns today for clip localization. EXAM: DIAGNOSTIC LEFT MAMMOGRAM POST STEREOTACTIC BIOPSY COMPARISON:  Previous exam(s). FINDINGS: Mammographic images were obtained today following stereotactic guided biopsy on 02/18/2019 of indeterminate calcifications in the lower outer quadrant of the LEFT breast. At the time of the biopsy on 02/18/2019, patient jumped causing dislodgement of the biopsy device from the breast precluding clip placement. Patient returns today for clip placement. Preprocedure exam shows a persistent hematoma  and several residual calcifications. Calcifications were localized using a coil shaped clip. IMPRESSION: Coil shaped clip is appropriately positioned at the site of a few residual calcifications at the biopsy site within the lower outer quadrant of the LEFT breast, at the anterior inferior margin of the residual post biopsy hematoma. Final Assessment: Post Procedure Mammograms for Marker Placement Electronically Signed   By: Franki Cabot M.D.   On: 03/05/2019 11:31    ELIGIBLE FOR AVAILABLE RESEARCH PROTOCOL: no  ASSESSMENT: 58 y.o. [place], [place] woman s/p left breast lower outer quadrant biopsy 02/18/2019 showing atypical ductal hyperplasia, with a >20% lifetime risk of breast cancer  (1) other contributors to risk:  (a) breast density C  (b) family history of breast and ovarian cancer   (2) antiestrogens discussed 03/22/2019: Patient opted against  (3) intensified screening  (a) mammography with tomography yearly in March  (b) breast MRI yearly in September    PLAN: We spent more than 50% of today's 60 minute appointment in counseling and coordination of care regarding the biology of the patient's diagnosis and the specifics of her situation. Emily Clark understands ADH means, first, relatively unrestricted growth of breast cells and, in addition, some morphologically different features from simple hyperplasia. Atypical ductal hyperplasia can be difficult to tell apart ductal cancer in situ. For those reasons lumpectomy was indicated.  ADH is also a marker of breast cancer risk. The risk of developing breast cancer in patients with ADH approaches 1% per year. The new cancer can develop in either breast. One half of those tumors would be invasive.  There are 2 ways of dealing with this problem.  One is risk reduction.  The other one is intensified screening.  One way to reduce the risk is bilateral mastectomies.  We discussed this in great detail and she understands the different types of  mastectomies as well as the option of no reconstruction and the various types of possible reconstructions.  Certainly if she were to go this way, nipple sparing mastectomy with implant reconstruction might be her optimal choice.  Bilateral mastectomies does not entirely eliminate the risk of breast cancer but would reduce it to less than  5% over the patient's lifetime.  Another approach to risk reduction is antiestrogens.  Aromatase inhibitors [anastrozole, letrozole and exemestane] are not a choice in pre-menopausal patients. Tamoxifen or raloxefene can be used in pre- or post-menopausal patients.   We discussed these agents in detail including their possible toxicities, side effects and complications.  We also discussed the fact that either of these classes of drugs taken for 5 years would cut her risk of breast cancer in half.   A third way to reduce her cancer risk--not specific to breast cancer--is to optimize her diet and exercise routine.  Ideally she would be on a mostly vegetable diet with limited carbohydrates. Meats and dairy are allowed. The exercise goal is 45 minutes 5 times a week of activity vigorous enough to induce perspiration. This would reduce her risk of any cancer developing by between 1 and 3%.    After discussing the options for risk reduction, we discussed intensified screening.  This would include yearly mammography with tomography, biannual breast exams by an MD, and a yearly breast MRI.  Emily Clark understands that adding the MRI may increase cost and also put her at risk for false positives, which may lead to unnecessary procedures.  The point of course would be to find any cancer that may develop at the very earliest stages to increase the chance of cure with minimal interventions (specifically without chemotherapy).  Finally we discussed genetics testing.  Emily Clark does qualify for this and she had her blood drawn today. In patients who carry a deleterious mutation [for example in a   BRCA gene], the risk of a new breast cancer developing in the future may be sufficiently great that the patient may choose bilateral mastectomies. However if she wishes to keep her breasts in that situation it is safe to do so. That would require intensified screening, which generally means not only yearly mammography but a yearly breast MRI as well.   After all this discussion Emily Clark was very clear she did not want to take antiestrogens because of side effects and concerns regarding blood clots and ovarian cancer.  She is very interested in intensified screening and we will operational lysed that.  Specifically she will have mammography in March every year and then an MRI of the breast every September.  She will meet with 1 of our genetics counselors virtually within the next month or so.  We will have the genetics blood test results within 3 to 4 weeks.  You will probably want to have those results on him before she makes a definitive decision regarding surgery  Alvira has a good understanding of the overall plan. She agrees with it. She knows the goal of treatment in her case is prevention. She will call with any problems that may develop before her next visit here.  Jenasis has a good understanding of the overall plan. She agrees with it. She knows the goal of treatment in her case is prevention. She will call with any problems that may develop before her next visit here, which will be in October.   Chauncey Cruel, MD   03/22/2019 11:26 AM Medical Oncology and Hematology [facility] [address] [place], [place] [address] Tel. [phone]    Fax. [phone]   This document serves as a record of services personally performed by Lurline Del, MD. It was created on his behalf by Wilburn Mylar, a trained medical scribe. The creation of this record is based on the scribe's personal observations  and the provider's statements to them.    I, Lurline Del MD, have  reviewed the above documentation for accuracy and completeness, and I agree with the above.

## 2019-03-22 ENCOUNTER — Other Ambulatory Visit: Payer: Self-pay | Admitting: Genetic Counselor

## 2019-03-22 ENCOUNTER — Inpatient Hospital Stay: Payer: Medicaid Other | Attending: Oncology | Admitting: Oncology

## 2019-03-22 ENCOUNTER — Inpatient Hospital Stay: Payer: Medicaid Other

## 2019-03-22 ENCOUNTER — Encounter: Payer: Self-pay | Admitting: Oncology

## 2019-03-22 ENCOUNTER — Other Ambulatory Visit: Payer: Self-pay

## 2019-03-22 DIAGNOSIS — N6092 Unspecified benign mammary dysplasia of left breast: Secondary | ICD-10-CM | POA: Insufficient documentation

## 2019-03-22 DIAGNOSIS — Z1239 Encounter for other screening for malignant neoplasm of breast: Secondary | ICD-10-CM

## 2019-03-22 DIAGNOSIS — N951 Menopausal and female climacteric states: Secondary | ICD-10-CM

## 2019-03-22 DIAGNOSIS — Z9189 Other specified personal risk factors, not elsewhere classified: Secondary | ICD-10-CM | POA: Insufficient documentation

## 2019-03-22 DIAGNOSIS — E079 Disorder of thyroid, unspecified: Secondary | ICD-10-CM | POA: Diagnosis not present

## 2019-03-22 DIAGNOSIS — Z803 Family history of malignant neoplasm of breast: Secondary | ICD-10-CM

## 2019-03-22 DIAGNOSIS — Z8041 Family history of malignant neoplasm of ovary: Secondary | ICD-10-CM | POA: Insufficient documentation

## 2019-03-22 DIAGNOSIS — I10 Essential (primary) hypertension: Secondary | ICD-10-CM | POA: Diagnosis not present

## 2019-03-22 DIAGNOSIS — Z8249 Family history of ischemic heart disease and other diseases of the circulatory system: Secondary | ICD-10-CM | POA: Insufficient documentation

## 2019-03-22 DIAGNOSIS — Z79899 Other long term (current) drug therapy: Secondary | ICD-10-CM | POA: Diagnosis not present

## 2019-03-22 DIAGNOSIS — Z87891 Personal history of nicotine dependence: Secondary | ICD-10-CM | POA: Insufficient documentation

## 2019-03-22 DIAGNOSIS — E785 Hyperlipidemia, unspecified: Secondary | ICD-10-CM | POA: Diagnosis not present

## 2019-03-23 ENCOUNTER — Telehealth: Payer: Self-pay | Admitting: Oncology

## 2019-03-23 ENCOUNTER — Encounter: Payer: Self-pay | Admitting: Genetic Counselor

## 2019-03-23 NOTE — Telephone Encounter (Signed)
Tried to reach regarding schedule °

## 2019-03-26 ENCOUNTER — Telehealth: Payer: Self-pay | Admitting: Genetic Counselor

## 2019-03-26 ENCOUNTER — Encounter (HOSPITAL_COMMUNITY): Payer: Self-pay | Admitting: *Deleted

## 2019-03-26 NOTE — Telephone Encounter (Signed)
Called patient regarding upcoming [facility] appointment, left patient a voicemail. Due to no e-mail being on file, I've converted this to a walk in visit.  message to karen.

## 2019-03-26 NOTE — Telephone Encounter (Signed)
Patient returned phone call regarding voicemail that was left, patient would like this appointment to be cancelled and for genetic consoler to contact her about more information.  Message to Santiago Glad.

## 2019-03-29 ENCOUNTER — Inpatient Hospital Stay: Payer: Medicaid Other | Admitting: Genetic Counselor

## 2019-03-30 ENCOUNTER — Telehealth: Payer: Self-pay | Admitting: Genetic Counselor

## 2019-03-30 NOTE — Telephone Encounter (Signed)
Had message that patient cancelled yesterdays appointment and wanted me to call.  I LM on her VM that I was calling and wanting to r/s the appointment from yesterday at a convenient time for her.  WE can do it over the computer so that it would reduce the number of visits she makes into the cancer center and try to schedule around her work schedule.

## 2019-04-09 ENCOUNTER — Telehealth: Payer: Self-pay | Admitting: Genetic Counselor

## 2019-04-09 NOTE — Telephone Encounter (Signed)
Left voicemail to confirm appt and verify information.

## 2019-04-12 ENCOUNTER — Inpatient Hospital Stay: Payer: Medicaid Other | Attending: Oncology | Admitting: Genetic Counselor

## 2019-04-12 ENCOUNTER — Encounter: Payer: Self-pay | Admitting: Genetic Counselor

## 2019-04-12 DIAGNOSIS — Z808 Family history of malignant neoplasm of other organs or systems: Secondary | ICD-10-CM | POA: Insufficient documentation

## 2019-04-12 DIAGNOSIS — Z8041 Family history of malignant neoplasm of ovary: Secondary | ICD-10-CM | POA: Insufficient documentation

## 2019-04-12 DIAGNOSIS — Z803 Family history of malignant neoplasm of breast: Secondary | ICD-10-CM | POA: Diagnosis not present

## 2019-04-12 DIAGNOSIS — Z9189 Other specified personal risk factors, not elsewhere classified: Secondary | ICD-10-CM | POA: Diagnosis not present

## 2019-04-12 DIAGNOSIS — Z8042 Family history of malignant neoplasm of prostate: Secondary | ICD-10-CM

## 2019-04-12 NOTE — Progress Notes (Addendum)
REFERRING PROVIDER: Chauncey Cruel, MD [address] [place], [place] [address]  PRIMARY PROVIDER:  Wyatt Haste, NP  PRIMARY REASON FOR VISIT:  1. At high risk for breast cancer   2. Family history of prostate cancer   3. Family history of ovarian cancer   4. Family history of breast cancer   5. Family history of brain cancer      HISTORY OF PRESENT ILLNESS:  I connected with Emily Clark on 04/12/2019 at 11:00 AM EDT by Webex video conference and verified that I am speaking with the correct person using two identifiers.   Patient location: Home Provider location: Office  Emily Clark, a 58 y.o. female, was seen for a [facility] cancer genetics consultation at the request of Dr. Jana Hakim due to a family history of cancer.  Emily Clark presents to clinic today to discuss the possibility of a hereditary predisposition to cancer, genetic testing, and to further clarify her future cancer risks, as well as potential cancer risks for family members.   Emily Clark is a 58 y.o. female with no personal history of cancer.  At her last mammogram, she was diagnosed with atypical ductal hyperplasia.  She had blood drawn for genetic testing. Today's visit is to confirm her diagnosis, go over genetic testing and determine if further testing is necessary.  CANCER HISTORY:   No history exists.     RISK FACTORS:  Menarche was at age 68.  First live birth at age 23.  OCP use for approximately 10 years.  Ovaries intact: yes.  Hysterectomy: no.  Menopausal status: postmenopausal.  HRT use: 0 years. Colonoscopy: yes; needs another to remove polyps. Mammogram within the last year: yes. Number of breast biopsies: 1. Up to date with pelvic exams: yes. Any excessive radiation exposure in the past: no  Past Medical History:  Diagnosis Date  . Family history of brain cancer   . Family history of breast cancer   . Family history of ovarian cancer   . Family history of prostate cancer   .  Hyperlipidemia   . Hypertension   . Thyroid disease     Past Surgical History:  Procedure Laterality Date  . CHOLECYSTECTOMY    . finger reattachment Right    third  . HEMORRHOID SURGERY    . TUBAL LIGATION    . tummy tuck      Social History   Socioeconomic History  . Marital status: Single    Spouse name: Not on file  . Number of children: 2  . Years of education: Not on file  . Highest education level: Some college, no degree  Occupational History  . Not on file  Social Needs  . Financial resource strain: Not on file  . Food insecurity:    Worry: Not on file    Inability: Not on file  . Transportation needs:    Medical: No    Non-medical: No  Tobacco Use  . Smoking status: Former Smoker    Packs/day: 0.50    Years: 41.00    Pack years: 20.50    Last attempt to quit: 09/06/2015    Years since quitting: 3.6  . Smokeless tobacco: Never Used  Substance and Sexual Activity  . Alcohol use: Not Currently    Comment: rare social  . Drug use: No  . Sexual activity: Not on file  Lifestyle  . Physical activity:    Days per week: Not on file    Minutes per session:  Not on file  . Stress: Not on file  Relationships  . Social connections:    Talks on phone: Not on file    Gets together: Not on file    Attends religious service: Not on file    Active member of club or organization: Not on file    Attends meetings of clubs or organizations: Not on file    Relationship status: Not on file  Other Topics Concern  . Not on file  Social History Narrative  . Not on file     FAMILY HISTORY:  We obtained a detailed, 4-generation family history.  Significant diagnoses are listed below: Family History  Problem Relation Age of Onset  . Stroke Mother 47  . Diabetes Mother   . Heart disease Father   . Coronary artery disease Father   . Brain cancer Daughter 55       glioblastoma  . Skin cancer Son   . Prostate cancer Maternal Uncle   . Breast cancer Paternal Aunt 85   . Ovarian cancer Paternal Aunt 74  . Prostate cancer Maternal Grandfather   . Stroke Paternal Grandmother   . Diabetes Paternal Grandfather   . Prostate cancer Maternal Uncle     The patient has a son and daughter.  Her son has had basal cell skin cancer, and her daughter was diagnosed with a glioblastoma at age 51.  She has one sister who is cancer free.  Both parents are deceased.  The patient's mother died of a stroke.  She had three brothers and a sister.  Two brothers had prostate cancer.  The maternal grandfather also had prostate cancer.  The patient's father had three sisters and two brothers.  One brother had an unknown form of cancer, and one sister had both breast and ovarian cancer.  There is no other reported family history of cancer on the paternal side.  Emily Clark is unaware of previous family history of genetic testing for hereditary cancer risks. Patient's maternal ancestors are of Caucasian descent, and paternal ancestors are of Caucasian descent. There is no reported Ashkenazi Jewish ancestry. There is no known consanguinity.  GENETIC COUNSELING ASSESSMENT: Emily Clark is a 58 y.o. female with a personal and family history of cancer which is somewhat suggestive of a hereditary cancer syndrome and predisposition to cancer. We, therefore, discussed and recommended the following at today's visit.   DISCUSSION: We discussed that 5 - 10% of breast cancer is hereditary, with most cases associated with BRCA mutations.  There are other genes that can be associated with hereditary breast and ovarian cancer syndromes.  These include ATM, CHEK2, and PALB2.  Several genes were analyzed at Emily Clark's initial appointment.  At that time, a STAT panel was ordered that looked at high risk breast cancer genes.  On Mar 30, 2019, genetic testing was reported out on the STAT panel and was determined to be negative.    We discussed that this is reassuring that there is not a high risk breast  cancer gene that is playing a role in the development of breast cancer in her family. However, there are other genes that can play a role in the development of breast, prostate, ovarian and brain cancer that were not tested.  These include: BRIP1, the Lynch syndrome genes, RAD51C, RAD51D, and a few others.  Based on the family history of prostate cancer, ovarian cancer and brain cancer, there are other genes that can play a role in the development of those  cancers, that may affect Emily Clark's medical management.  We discussed that testing is beneficial for several reasons including knowing how to follow individuals after completing their treatment, identifying whether potential treatment options such as PARP inhibitors would be beneficial, and understand if other family members could be at risk for cancer and allow them to undergo genetic testing.   We reviewed the characteristics, features and inheritance patterns of hereditary cancer syndromes. We also discussed genetic testing, including the appropriate family members to test, the process of testing, insurance coverage and turn-around-time for results. We discussed the implications of a negative, positive and/or variant of uncertain significant result. We recommended Emily Clark pursue genetic testing for the multi cancer gene panel. The Multi-Gene Panel offered by [facility] includes sequencing and/or deletion duplication testing of the following 85 genes: AIP, ALK, APC, ATM, AXIN2,BAP1,  BARD1, BLM, BMPR1A, BRCA1, BRCA2, BRIP1, CASR, CDC73, CDH1, CDK4, CDKN1B, CDKN1C, CDKN2A (p14ARF), CDKN2A (p16INK4a), CEBPA, CHEK2, CTNNA1, DICER1, DIS3L2, EGFR (c.2369C>T, p.Thr790Met variant only), EPCAM (Deletion/duplication testing only), FH, FLCN, GATA2, GPC3, GREM1 (Promoter region deletion/duplication testing only), HOXB13 (c.251G>A, p.Gly84Glu), HRAS, KIT, MAX, MEN1, MET, MITF (c.952G>A, p.Glu318Lys variant only), MLH1, MSH2, MSH3, MSH6, MUTYH, NBN, NF1, NF2, NTHL1,  PALB2, PDGFRA, PHOX2B, PMS2, POLD1, POLE, POT1, PRKAR1A, PTCH1, PTEN, RAD50, RAD51C, RAD51D, RB1, RECQL4, RET, RNF43, RUNX1, SDHAF2, SDHA (sequence changes only), SDHB, SDHC, SDHD, SMAD4, SMARCA4, SMARCB1, SMARCE1, STK11, SUFU, TERC, TERT, TMEM127, TP53, TSC1, TSC2, VHL, WRN and WT1.    Based on Emily Clark's personal and family history of cancer, she meets medical criteria for genetic testing. Despite that she meets criteria, she may still have an out of pocket cost. We discussed that if her out of pocket cost for testing is over $100, the laboratory will call and confirm whether she wants to proceed with testing.  If the out of pocket cost of testing is less than $100 she will be billed by the genetic testing laboratory.   PLAN: After considering the risks, benefits, and limitations, Emily Clark provided informed consent to reflex her genetic testing to included analysis of the multi cancer panel genes. Results should be available within approximately 2-3 weeks' time, at which point they will be disclosed by telephone to Emily Clark, as will any additional recommendations warranted by these results. Emily Clark will receive a summary of her genetic counseling visit and a copy of her results once available. This information will also be available in Epic.   Lastly, we encouraged Emily Clark to remain in contact with cancer genetics annually so that we can continuously update the family history and inform her of any changes in cancer genetics and testing that may be of benefit for this family.   Emily Clark questions were answered to her satisfaction today. Our contact information was provided should additional questions or concerns arise. Thank you for the referral and allowing [place] to share in the care of your patient.   Emily Clark P. Emily Clark, Emily Clark, [facility] Certified Genetic Counselor [email or web address] phone: [phone]  The patient was seen for a total of 45 minutes in face-to-face Webex genetic  counseling.  This patient was discussed with Drs. Magrinat, Lindi Adie and/or Burr Medico who agrees with the above.    _______________________________________________________________________ For Office Staff:  Number of people involved in session: 1 Was an Intern/ student involved with case: no

## 2019-04-13 ENCOUNTER — Ambulatory Visit: Payer: Self-pay | Admitting: Genetic Counselor

## 2019-04-13 ENCOUNTER — Telehealth: Payer: Self-pay | Admitting: Genetic Counselor

## 2019-04-13 DIAGNOSIS — Z1379 Encounter for other screening for genetic and chromosomal anomalies: Secondary | ICD-10-CM | POA: Insufficient documentation

## 2019-04-13 NOTE — Telephone Encounter (Signed)
LM on VM that results were back on the remainder of her testing.  Please CB.

## 2019-04-13 NOTE — Telephone Encounter (Signed)
Revealed negative genetic testing.  Discussed that we do not know why she has atypical hyperplasia or why there is cancer in the family. It could be due to a different gene that we are not testing, or maybe our current technology may not be able to pick something up.  It will be important for her to keep in contact with genetics to keep up with whether additional testing may be needed.

## 2019-04-13 NOTE — Progress Notes (Signed)
HPI:  [name] was previously seen in the [facility] clinic due to a family history of cancer and personal history of atypical hyperplasia, and concerns regarding a hereditary predisposition to cancer. Please refer to our prior cancer genetics clinic note for more information regarding our discussion, assessment and recommendations, at the time. [name] recent genetic test results were disclosed to her, as were recommendations warranted by these results. These results and recommendations are discussed in more detail below.  CANCER HISTORY:   No history exists.    FAMILY HISTORY:  We obtained a detailed, 4-generation family history.  Significant diagnoses are listed below: Family History  Problem Relation Age of Onset  . Stroke Mother 99  . Diabetes Mother   . Heart disease Father   . Coronary artery disease Father   . Brain cancer Daughter 61       glioblastoma  . Skin cancer Son   . Prostate cancer Maternal Uncle   . Breast cancer Paternal Aunt 85  . Ovarian cancer Paternal Aunt 70  . Prostate cancer Maternal Grandfather   . Stroke Paternal Grandmother   . Diabetes Paternal Grandfather   . Prostate cancer Maternal Uncle     The patient has a son and daughter.  Her son has had basal cell skin cancer, and her daughter was diagnosed with a glioblastoma at age 34.  She has one sister who is cancer free.  Both parents are deceased.  The patient's mother died of a stroke.  She had three brothers and a sister.  Two brothers had prostate cancer.  The maternal grandfather also had prostate cancer.  The patient's father had three sisters and two brothers.  One brother had an unknown form of cancer, and one sister had both breast and ovarian cancer.  There is no other reported family history of cancer on the paternal side.  [name] is unaware of previous family history of genetic testing for hereditary cancer risks. Patient's maternal ancestors are of Caucasian  descent, and paternal ancestors are of Caucasian descent. There is no reported Ashkenazi Jewish ancestry. There is no known consanguinity.   GENETIC TEST RESULTS: Genetic testing reported out on April 12, 2019 through the multi-cancer panel found no pathogenic mutations. The Multi-Gene Panel offered by [facility] includes sequencing and/or deletion duplication testing of the following 85 genes: AIP, ALK, APC, ATM, AXIN2,BAP1,  BARD1, BLM, BMPR1A, BRCA1, BRCA2, BRIP1, CASR, CDC73, CDH1, CDK4, CDKN1B, CDKN1C, CDKN2A (p14ARF), CDKN2A (p16INK4a), CEBPA, CHEK2, CTNNA1, DICER1, DIS3L2, EGFR (c.2369C>T, p.Thr790Met variant only), EPCAM (Deletion/duplication testing only), FH, FLCN, GATA2, GPC3, GREM1 (Promoter region deletion/duplication testing only), HOXB13 (c.251G>A, p.Gly84Glu), HRAS, KIT, MAX, MEN1, MET, MITF (c.952G>A, p.Glu318Lys variant only), MLH1, MSH2, MSH3, MSH6, MUTYH, NBN, NF1, NF2, NTHL1, PALB2, PDGFRA, PHOX2B, PMS2, POLD1, POLE, POT1, PRKAR1A, PTCH1, PTEN, RAD50, RAD51C, RAD51D, RB1, RECQL4, RET, RNF43, RUNX1, SDHAF2, SDHA (sequence changes only), SDHB, SDHC, SDHD, SMAD4, SMARCA4, SMARCB1, SMARCE1, STK11, SUFU, TERC, TERT, TMEM127, TP53, TSC1, TSC2, VHL, WRN and WT1.  The test report has been scanned into EPIC and is located under the Molecular Pathology section of the Results Review tab.  A portion of the result report is included below for reference.     We discussed with [name] that because current genetic testing is not perfect, it is possible there may be a gene mutation in one of these genes that current testing cannot detect, but that chance is small.  We also discussed, that there could be another gene that  has not yet been discovered, or that we have not yet tested, that is responsible for the cancer diagnoses in the family. It is also possible there is a hereditary cause for the cancer in the family that [name] did not inherit and therefore was not identified in her testing.  Therefore,  it is important to remain in touch with cancer genetics in the future so that we can continue to offer [name] the most up to date genetic testing.   ADDITIONAL GENETIC TESTING: We discussed with [name] that there are other genes that are associated with increased cancer risk that can be analyzed. Should [name] wish to pursue additional genetic testing, we are happy to discuss and coordinate this testing, at any time.    CANCER SCREENING RECOMMENDATIONS: [name] test result is considered negative (normal).  This means that we have not identified a hereditary cause for her family history of cancer and personal history of atypical hyperplasia at this time. Most cancers happen by chance and this negative test suggests that her cancer may fall into this category.    While reassuring, this does not definitively rule out a hereditary predisposition to cancer. It is still possible that there could be genetic mutations that are undetectable by current technology. There could be genetic mutations in genes that have not been tested or identified to increase cancer risk.  Therefore, it is recommended she continue to follow the cancer management and screening guidelines provided by her oncology and primary healthcare provider.   An individual's cancer risk and medical management are not determined by genetic test results alone. Overall cancer risk assessment incorporates additional factors, including personal medical history, family history, and any available genetic information that may result in a personalized plan for cancer prevention and surveillance  RECOMMENDATIONS FOR FAMILY MEMBERS:  Individuals in this family might be at some increased risk of developing cancer, over the general population risk, simply due to the family history of cancer.  We recommended women in this family have a yearly mammogram beginning at age 67, or 58 years younger than the earliest onset of cancer, an annual clinical  breast exam, and perform monthly breast self-exams. Women in this family should also have a gynecological exam as recommended by their primary provider. All family members should have a colonoscopy by age 32.  FOLLOW-UP: Lastly, we discussed with [name] that cancer genetics is a rapidly advancing field and it is possible that new genetic tests will be appropriate for her and/or her family members in the future. We encouraged her to remain in contact with cancer genetics on an annual basis so we can update her personal and family histories and let her know of advances in cancer genetics that may benefit this family.   Our contact number was provided. [name] questions were answered to her satisfaction, and she knows she is welcome to call us at anytime with additional questions or concerns.   Roma Kayser, MS, [facility] Certified Genetic Counselor [email or web address]

## 2019-04-21 ENCOUNTER — Other Ambulatory Visit: Payer: Self-pay | Admitting: General Surgery

## 2019-04-21 DIAGNOSIS — N6092 Unspecified benign mammary dysplasia of left breast: Secondary | ICD-10-CM

## 2019-06-02 ENCOUNTER — Encounter (HOSPITAL_BASED_OUTPATIENT_CLINIC_OR_DEPARTMENT_OTHER): Payer: Self-pay | Admitting: *Deleted

## 2019-06-02 ENCOUNTER — Other Ambulatory Visit: Payer: Self-pay

## 2019-06-05 ENCOUNTER — Other Ambulatory Visit (HOSPITAL_COMMUNITY)
Admission: RE | Admit: 2019-06-05 | Discharge: 2019-06-05 | Disposition: A | Discharge status: Home / Self Care | Payer: Medicaid Other | Source: Ambulatory Visit | Attending: General Surgery | Admitting: General Surgery

## 2019-06-05 DIAGNOSIS — Z1159 Encounter for screening for other viral diseases: Secondary | ICD-10-CM | POA: Diagnosis present

## 2019-06-05 LAB — SARS CORONAVIRUS 2 (TAT 6-24 HRS): SARS Coronavirus 2: NEGATIVE

## 2019-06-08 ENCOUNTER — Ambulatory Visit
Admission: RE | Admit: 2019-06-08 | Discharge: 2019-06-08 | Disposition: A | Discharge status: Home / Self Care | Payer: Medicaid Other | Source: Ambulatory Visit | Attending: General Surgery | Admitting: General Surgery

## 2019-06-08 ENCOUNTER — Other Ambulatory Visit: Payer: Self-pay

## 2019-06-08 DIAGNOSIS — N6092 Unspecified benign mammary dysplasia of left breast: Secondary | ICD-10-CM

## 2019-06-09 ENCOUNTER — Ambulatory Visit (HOSPITAL_BASED_OUTPATIENT_CLINIC_OR_DEPARTMENT_OTHER): Payer: Medicaid Other | Admitting: Anesthesiology

## 2019-06-09 ENCOUNTER — Ambulatory Visit
Admission: RE | Admit: 2019-06-09 | Discharge: 2019-06-09 | Disposition: A | Discharge status: Home / Self Care | Payer: Medicaid Other | Source: Ambulatory Visit | Attending: General Surgery | Admitting: General Surgery

## 2019-06-09 ENCOUNTER — Encounter (HOSPITAL_BASED_OUTPATIENT_CLINIC_OR_DEPARTMENT_OTHER): Payer: Self-pay | Admitting: *Deleted

## 2019-06-09 ENCOUNTER — Other Ambulatory Visit: Payer: Self-pay

## 2019-06-09 ENCOUNTER — Encounter (HOSPITAL_BASED_OUTPATIENT_CLINIC_OR_DEPARTMENT_OTHER)
Admission: RE | Disposition: A | Discharge status: Home / Self Care | Payer: Self-pay | Source: Home / Self Care | Attending: General Surgery

## 2019-06-09 ENCOUNTER — Ambulatory Visit (HOSPITAL_BASED_OUTPATIENT_CLINIC_OR_DEPARTMENT_OTHER)
Admission: RE | Admit: 2019-06-09 | Discharge: 2019-06-09 | Disposition: A | Discharge status: Home / Self Care | Payer: Medicaid Other | Attending: General Surgery | Admitting: General Surgery

## 2019-06-09 DIAGNOSIS — K219 Gastro-esophageal reflux disease without esophagitis: Secondary | ICD-10-CM | POA: Diagnosis not present

## 2019-06-09 DIAGNOSIS — E7800 Pure hypercholesterolemia, unspecified: Secondary | ICD-10-CM | POA: Diagnosis not present

## 2019-06-09 DIAGNOSIS — N6092 Unspecified benign mammary dysplasia of left breast: Secondary | ICD-10-CM | POA: Diagnosis not present

## 2019-06-09 DIAGNOSIS — N6082 Other benign mammary dysplasias of left breast: Secondary | ICD-10-CM | POA: Diagnosis not present

## 2019-06-09 DIAGNOSIS — Z79899 Other long term (current) drug therapy: Secondary | ICD-10-CM | POA: Insufficient documentation

## 2019-06-09 DIAGNOSIS — Z87891 Personal history of nicotine dependence: Secondary | ICD-10-CM | POA: Insufficient documentation

## 2019-06-09 DIAGNOSIS — Z79890 Hormone replacement therapy (postmenopausal): Secondary | ICD-10-CM | POA: Diagnosis not present

## 2019-06-09 DIAGNOSIS — E039 Hypothyroidism, unspecified: Secondary | ICD-10-CM | POA: Diagnosis not present

## 2019-06-09 DIAGNOSIS — I10 Essential (primary) hypertension: Secondary | ICD-10-CM | POA: Diagnosis not present

## 2019-06-09 HISTORY — DX: Other complications of anesthesia, initial encounter: T88.59XA

## 2019-06-09 HISTORY — DX: Hypothyroidism, unspecified: E03.9

## 2019-06-09 HISTORY — PX: BREAST LUMPECTOMY WITH RADIOACTIVE SEED LOCALIZATION: SHX6424

## 2019-06-09 HISTORY — DX: Gastro-esophageal reflux disease without esophagitis: K21.9

## 2019-06-09 HISTORY — DX: Unspecified lump in the left breast, unspecified quadrant: N63.20

## 2019-06-09 HISTORY — DX: Other specified postprocedural states: Z98.890

## 2019-06-09 SURGERY — BREAST LUMPECTOMY WITH RADIOACTIVE SEED LOCALIZATION
Anesthesia: General | Site: Breast | Laterality: Left

## 2019-06-09 MED ORDER — ACETAMINOPHEN 500 MG PO TABS
1000.0000 mg | ORAL_TABLET | ORAL | Status: AC
  Administered 2019-06-09: 1000 mg via ORAL

## 2019-06-09 MED ORDER — ONDANSETRON HCL 4 MG/2ML IJ SOLN
INTRAMUSCULAR | Status: DC | PRN
  Administered 2019-06-09: 4 mg via INTRAVENOUS

## 2019-06-09 MED ORDER — GABAPENTIN 300 MG PO CAPS
300.0000 mg | ORAL_CAPSULE | ORAL | Status: AC
  Administered 2019-06-09: 300 mg via ORAL

## 2019-06-09 MED ORDER — SCOPOLAMINE 1 MG/3DAYS TD PT72: 1.0000 | MEDICATED_PATCH | Freq: Once | TRANSDERMAL | Status: DC

## 2019-06-09 MED ORDER — LIDOCAINE 2% (20 MG/ML) 5 ML SYRINGE
INTRAMUSCULAR | Status: AC
  Filled 2019-06-09: qty 5

## 2019-06-09 MED ORDER — VANCOMYCIN HCL 1000 MG IV SOLR: INTRAVENOUS | Status: DC | PRN

## 2019-06-09 MED ORDER — HYDROCODONE-ACETAMINOPHEN 5-325 MG PO TABS: 1.0000 | ORAL_TABLET | Freq: Four times a day (QID) | ORAL | 0 refills | Status: DC | PRN

## 2019-06-09 MED ORDER — BUPIVACAINE-EPINEPHRINE (PF) 0.25% -1:200000 IJ SOLN
INTRAMUSCULAR | Status: DC | PRN
  Administered 2019-06-09: 20 mL

## 2019-06-09 MED ORDER — FENTANYL CITRATE (PF) 100 MCG/2ML IJ SOLN
25.0000 ug | INTRAMUSCULAR | Status: DC | PRN
  Administered 2019-06-09: 50 ug via INTRAVENOUS

## 2019-06-09 MED ORDER — MIDAZOLAM HCL 2 MG/2ML IJ SOLN
1.0000 mg | INTRAMUSCULAR | Status: DC | PRN
  Administered 2019-06-09: 2 mg via INTRAVENOUS

## 2019-06-09 MED ORDER — CHLORHEXIDINE GLUCONATE CLOTH 2 % EX PADS: 6.0000 | MEDICATED_PAD | Freq: Once | CUTANEOUS | Status: DC

## 2019-06-09 MED ORDER — VANCOMYCIN HCL IN DEXTROSE 1-5 GM/200ML-% IV SOLN
1000.0000 mg | INTRAVENOUS | Status: AC
  Administered 2019-06-09: 1000 mg via INTRAVENOUS

## 2019-06-09 MED ORDER — DEXAMETHASONE SODIUM PHOSPHATE 10 MG/ML IJ SOLN
INTRAMUSCULAR | Status: AC
  Filled 2019-06-09: qty 1

## 2019-06-09 MED ORDER — CELECOXIB 200 MG PO CAPS
ORAL_CAPSULE | ORAL | Status: AC
  Filled 2019-06-09: qty 1

## 2019-06-09 MED ORDER — OXYCODONE HCL 5 MG PO TABS: 5.0000 mg | ORAL_TABLET | Freq: Once | ORAL | Status: DC | PRN

## 2019-06-09 MED ORDER — PROPOFOL 10 MG/ML IV BOLUS
INTRAVENOUS | Status: AC
  Filled 2019-06-09: qty 20

## 2019-06-09 MED ORDER — CELECOXIB 200 MG PO CAPS
200.0000 mg | ORAL_CAPSULE | ORAL | Status: AC
  Administered 2019-06-09: 200 mg via ORAL

## 2019-06-09 MED ORDER — LACTATED RINGERS IV SOLN
INTRAVENOUS | Status: DC
  Administered 2019-06-09 (×2): via INTRAVENOUS

## 2019-06-09 MED ORDER — ONDANSETRON HCL 4 MG/2ML IJ SOLN
INTRAMUSCULAR | Status: AC
  Filled 2019-06-09: qty 2

## 2019-06-09 MED ORDER — ACETAMINOPHEN 160 MG/5ML PO SOLN: 1000.0000 mg | Freq: Once | ORAL | Status: DC | PRN

## 2019-06-09 MED ORDER — EPHEDRINE SULFATE 50 MG/ML IJ SOLN
INTRAMUSCULAR | Status: DC | PRN
  Administered 2019-06-09: 10 mg via INTRAVENOUS

## 2019-06-09 MED ORDER — PROPOFOL 500 MG/50ML IV EMUL
INTRAVENOUS | Status: DC | PRN
  Administered 2019-06-09: 25 ug/kg/min via INTRAVENOUS

## 2019-06-09 MED ORDER — FENTANYL CITRATE (PF) 100 MCG/2ML IJ SOLN
INTRAMUSCULAR | Status: AC
  Filled 2019-06-09: qty 2

## 2019-06-09 MED ORDER — GABAPENTIN 300 MG PO CAPS
ORAL_CAPSULE | ORAL | Status: AC
  Filled 2019-06-09: qty 1

## 2019-06-09 MED ORDER — ACETAMINOPHEN 500 MG PO TABS
ORAL_TABLET | ORAL | Status: AC
  Filled 2019-06-09: qty 2

## 2019-06-09 MED ORDER — ACETAMINOPHEN 500 MG PO TABS: 1000.0000 mg | ORAL_TABLET | Freq: Once | ORAL | Status: DC | PRN

## 2019-06-09 MED ORDER — FENTANYL CITRATE (PF) 100 MCG/2ML IJ SOLN
50.0000 ug | INTRAMUSCULAR | Status: DC | PRN
  Administered 2019-06-09: 50 ug via INTRAVENOUS

## 2019-06-09 MED ORDER — VANCOMYCIN HCL IN DEXTROSE 1-5 GM/200ML-% IV SOLN
INTRAVENOUS | Status: AC
  Filled 2019-06-09: qty 200

## 2019-06-09 MED ORDER — PROPOFOL 10 MG/ML IV BOLUS
INTRAVENOUS | Status: DC | PRN
  Administered 2019-06-09: 150 mg via INTRAVENOUS

## 2019-06-09 MED ORDER — OXYCODONE HCL 5 MG/5ML PO SOLN: 5.0000 mg | Freq: Once | ORAL | Status: DC | PRN

## 2019-06-09 MED ORDER — DEXAMETHASONE SODIUM PHOSPHATE 4 MG/ML IJ SOLN
INTRAMUSCULAR | Status: DC | PRN
  Administered 2019-06-09: 10 mg via INTRAVENOUS

## 2019-06-09 MED ORDER — METHYLENE BLUE 0.5 % INJ SOLN
INTRAVENOUS | Status: AC
  Filled 2019-06-09: qty 10

## 2019-06-09 MED ORDER — LIDOCAINE HCL (CARDIAC) PF 100 MG/5ML IV SOSY
PREFILLED_SYRINGE | INTRAVENOUS | Status: DC | PRN
  Administered 2019-06-09: 50 mg via INTRAVENOUS

## 2019-06-09 MED ORDER — ACETAMINOPHEN 10 MG/ML IV SOLN: 1000.0000 mg | Freq: Once | INTRAVENOUS | Status: DC | PRN

## 2019-06-09 MED ORDER — TRAMADOL HCL 50 MG PO TABS: 50.0000 mg | ORAL_TABLET | Freq: Four times a day (QID) | ORAL | 0 refills | Status: DC | PRN

## 2019-06-09 MED ORDER — BUPIVACAINE-EPINEPHRINE (PF) 0.25% -1:200000 IJ SOLN
INTRAMUSCULAR | Status: AC
  Filled 2019-06-09: qty 60

## 2019-06-09 MED ORDER — SODIUM CHLORIDE (PF) 0.9 % IJ SOLN
INTRAMUSCULAR | Status: AC
  Filled 2019-06-09: qty 10

## 2019-06-09 MED ORDER — MIDAZOLAM HCL 2 MG/2ML IJ SOLN
INTRAMUSCULAR | Status: AC
  Filled 2019-06-09: qty 2

## 2019-06-09 SURGICAL SUPPLY — 44 items
APPLIER CLIP 9.375 MED OPEN (MISCELLANEOUS)
BLADE SURG 15 STRL LF DISP TIS (BLADE) ×1 IMPLANT
BLADE SURG 15 STRL SS (BLADE) ×2
CANISTER SUC SOCK COL 7IN (MISCELLANEOUS) IMPLANT
CANISTER SUCT 1200ML W/VALVE (MISCELLANEOUS) IMPLANT
CHLORAPREP W/TINT 26 (MISCELLANEOUS) ×3 IMPLANT
CLIP APPLIE 9.375 MED OPEN (MISCELLANEOUS) IMPLANT
COVER BACK TABLE REUSABLE LG (DRAPES) ×3 IMPLANT
COVER MAYO STAND REUSABLE (DRAPES) ×3 IMPLANT
COVER PROBE W GEL 5X96 (DRAPES) ×3 IMPLANT
COVER WAND RF STERILE (DRAPES) IMPLANT
DECANTER SPIKE VIAL GLASS SM (MISCELLANEOUS) IMPLANT
DERMABOND ADVANCED (GAUZE/BANDAGES/DRESSINGS) ×2
DERMABOND ADVANCED .7 DNX12 (GAUZE/BANDAGES/DRESSINGS) ×1 IMPLANT
DRAPE LAPAROSCOPIC ABDOMINAL (DRAPES) ×3 IMPLANT
DRAPE UTILITY XL STRL (DRAPES) ×3 IMPLANT
ELECT COATED BLADE 2.86 ST (ELECTRODE) ×3 IMPLANT
ELECT REM PT RETURN 9FT ADLT (ELECTROSURGICAL) ×3
ELECTRODE REM PT RTRN 9FT ADLT (ELECTROSURGICAL) ×1 IMPLANT
GLOVE BIO SURGEON STRL SZ7 (GLOVE) ×3 IMPLANT
GLOVE BIO SURGEON STRL SZ7.5 (GLOVE) ×6 IMPLANT
GLOVE BIOGEL PI IND STRL 7.0 (GLOVE) ×1 IMPLANT
GLOVE BIOGEL PI INDICATOR 7.0 (GLOVE) ×2
GLOVE EXAM NITRILE MD LF STRL (GLOVE) ×3 IMPLANT
GOWN STRL REUS W/ TWL LRG LVL3 (GOWN DISPOSABLE) ×3 IMPLANT
GOWN STRL REUS W/TWL LRG LVL3 (GOWN DISPOSABLE) ×6
ILLUMINATOR WAVEGUIDE N/F (MISCELLANEOUS) IMPLANT
KIT MARKER MARGIN INK (KITS) ×3 IMPLANT
LIGHT WAVEGUIDE WIDE FLAT (MISCELLANEOUS) IMPLANT
NEEDLE HYPO 25X1 1.5 SAFETY (NEEDLE) ×3 IMPLANT
NS IRRIG 1000ML POUR BTL (IV SOLUTION) IMPLANT
PACK BASIN DAY SURGERY FS (CUSTOM PROCEDURE TRAY) ×3 IMPLANT
PENCIL BUTTON HOLSTER BLD 10FT (ELECTRODE) ×3 IMPLANT
SLEEVE SCD COMPRESS KNEE MED (MISCELLANEOUS) ×3 IMPLANT
SPONGE LAP 18X18 RF (DISPOSABLE) ×3 IMPLANT
SUT MON AB 4-0 PC3 18 (SUTURE) IMPLANT
SUT SILK 2 0 SH (SUTURE) IMPLANT
SUT VICRYL 3-0 CR8 SH (SUTURE) ×3 IMPLANT
SYR CONTROL 10ML LL (SYRINGE) IMPLANT
TOWEL GREEN STERILE FF (TOWEL DISPOSABLE) ×3 IMPLANT
TRAY FAXITRON CT DISP (TRAY / TRAY PROCEDURE) ×3 IMPLANT
TUBE CONNECTING 20'X1/4 (TUBING) ×1
TUBE CONNECTING 20X1/4 (TUBING) ×2 IMPLANT
YANKAUER SUCT BULB TIP NO VENT (SUCTIONS) IMPLANT

## 2019-06-09 NOTE — Discharge Instructions (Signed)
NO TYLENOL BEFORE 2:30 PM Today!       Post Anesthesia Home Care Instructions  Activity: Get plenty of rest for the remainder of the day. A responsible individual must stay with you for 24 hours following the procedure.  For the next 24 hours, DO NOT: -Drive a car -Paediatric nurse -Drink alcoholic beverages -Take any medication unless instructed by your physician -Make any legal decisions or sign important papers.  Meals: Start with liquid foods such as gelatin or soup. Progress to regular foods as tolerated. Avoid greasy, spicy, heavy foods. If nausea and/or vomiting occur, drink only clear liquids until the nausea and/or vomiting subsides. Call your physician if vomiting continues.  Special Instructions/Symptoms: Your throat may feel dry or sore from the anesthesia or the breathing tube placed in your throat during surgery. If this causes discomfort, gargle with warm salt water. The discomfort should disappear within 24 hours.  If you had a scopolamine patch placed behind your ear for the management of post- operative nausea and/or vomiting:  1. The medication in the patch is effective for 72 hours, after which it should be removed.  Wrap patch in a tissue and discard in the trash. Wash hands thoroughly with soap and water. 2. You may remove the patch earlier than 72 hours if you experience unpleasant side effects which may include dry mouth, dizziness or visual disturbances. 3. Avoid touching the patch. Wash your hands with soap and water after contact with the patch.

## 2019-06-09 NOTE — Transfer of Care (Signed)
Immediate Anesthesia Transfer of Care Note  Patient: Emily Clark  Procedure(s) Performed: LEFT BREAST LUMPECTOMY WITH RADIOACTIVE SEED LOCALIZATION (Left Breast)  Patient Location: PACU  Anesthesia Type:General  Level of Consciousness: sedated, lethargic and responds to stimulation  Airway & Oxygen Therapy: Patient Spontanous Breathing and Patient connected to nasal cannula oxygen  Post-op Assessment: Report given to RN and Post -op Vital signs reviewed and stable  Post vital signs: Reviewed and stable  Last Vitals:  Vitals Value Taken Time  BP 117/77 06/09/19 1117  Temp    Pulse 72 06/09/19 1118  Resp 13 06/09/19 1118  SpO2 100 % 06/09/19 1118  Vitals shown include unvalidated device data.  Last Pain:  Vitals:   06/09/19 0833  TempSrc: Oral  PainSc: 0-No pain         Complications: No apparent anesthesia complications

## 2019-06-09 NOTE — Op Note (Signed)
06/09/2019  11:10 AM  PATIENT:  Emily Clark  58 y.o. female  PRE-OPERATIVE DIAGNOSIS:  LEFT BREAST ATYPICAL DUCT HYPERPLASIA  POST-OPERATIVE DIAGNOSIS:  LEFT BREAST ATYPICAL DUCT HYPERPLASIA  PROCEDURE:  Procedure(s): LEFT BREAST LUMPECTOMY WITH RADIOACTIVE SEED LOCALIZATION (Left)  SURGEON:  Surgeon(s) and Role:    Jovita Kussmaul, MD - Primary  PHYSICIAN ASSISTANT:   ASSISTANTS: none   ANESTHESIA:   local and general  EBL:  minimal   BLOOD ADMINISTERED:none  DRAINS: none   LOCAL MEDICATIONS USED:  MARCAINE     SPECIMEN:  Source of Specimen:  left breast tissue  DISPOSITION OF SPECIMEN:  PATHOLOGY  COUNTS:  YES  TOURNIQUET:  * No tourniquets in log *  DICTATION: .Dragon Dictation   After informed consent was obtained the patient was brought to the operating room and placed in the supine position on the operating table.  After adequate induction of general anesthesia the patient's left breast was prepped with ChloraPrep, allowed to dry, and draped in usual sterile manner.  An appropriate timeout was performed.  Previously an I-125 seed was placed in the lower outer quadrant of the left breast to mark an area of atypical ductal hyperplasia.  The neoprobe was set to I-125 in the area of radioactivity was readily identified.  The area around this was infiltrated with quarter percent Marcaine.  The patient had 2 small scars in the lower outer left breast that she wanted removed.  Because of this we made an elliptical incision overlying the area of radioactivity in a radial fashion to include both scars with a 15 blade knife.  The incision was carried through the skin and subcutaneous tissue sharply with electrocautery.  A circular portion of breast tissue was then excised sharply around the radioactive seed while checking the area of radioactivity frequently.  Once the specimen was removed it was oriented with the appropriate paint colors.  A specimen radiograph was obtained  that showed the clip and seed to be near the center of the specimen.  The specimen was then sent to pathology for further evaluation.  Hemostasis was achieved using the Bovie electrocautery.  The wound was irrigated with saline and infiltrated with more quarter percent Marcaine.  The deep layer of the wound was then closed with interrupted 3-0 Vicryl stitches.  The skin was then closed with a running 4-0 Monocryl subcuticular stitch.  Dermabond dressings were applied.  The patient tolerated the procedure well.  At the end of the case all needle sponge and instrument counts were correct.  The patient was then awakened and taken to recovery in stable condition.  PLAN OF CARE: Discharge to home after PACU  PATIENT DISPOSITION:  PACU - hemodynamically stable.   Delay start of Pharmacological VTE agent (>24hrs) due to surgical blood loss or risk of bleeding: not applicable

## 2019-06-09 NOTE — Interval H&P Note (Signed)
History and Physical Interval Note:  06/09/2019 9:58 AM  Emily Clark  has presented today for surgery, with the diagnosis of LEFT BREAST ADH.  The various methods of treatment have been discussed with the patient and family. After consideration of risks, benefits and other options for treatment, the patient has consented to  Procedure(s): LEFT BREAST LUMPECTOMY WITH RADIOACTIVE SEED LOCALIZATION (Left) as a surgical intervention.  The patient's history has been reviewed, patient examined, no change in status, stable for surgery.  I have reviewed the patient's chart and labs.  Questions were answered to the patient's satisfaction.     Autumn Messing III

## 2019-06-09 NOTE — Anesthesia Procedure Notes (Signed)
Procedure Name: LMA Insertion Date/Time: 06/09/2019 10:34 AM Performed by: Lyndee Leo, CRNA Pre-anesthesia Checklist: Patient identified, Emergency Drugs available, Suction available and Patient being monitored Patient Re-evaluated:Patient Re-evaluated prior to induction Oxygen Delivery Method: Circle system utilized Preoxygenation: Pre-oxygenation with 100% oxygen Induction Type: IV induction Ventilation: Mask ventilation without difficulty LMA: LMA inserted LMA Size: 3.0 Number of attempts: 1 Airway Equipment and Method: Bite block Placement Confirmation: positive ETCO2 Tube secured with: Tape Dental Injury: Teeth and Oropharynx as per pre-operative assessment

## 2019-06-09 NOTE — Anesthesia Preprocedure Evaluation (Signed)
Anesthesia Evaluation    Reviewed: Allergy & Precautions, NPO status , Patient's Chart, lab work & pertinent test results, reviewed documented beta blocker date and time   History of Anesthesia Complications (+) PONV and history of anesthetic complications  Airway Mallampati: II  TM Distance: >3 FB Neck ROM: Full    Dental  (+) Dental Advisory Given   Pulmonary neg sleep apnea, neg COPD, neg recent URI, former smoker,    breath sounds clear to auscultation       Cardiovascular hypertension, Pt. on medications and Pt. on home beta blockers (-) angina+CHF  (-) Past MI  Rhythm:Regular     Neuro/Psych negative neurological ROS  negative psych ROS   GI/Hepatic Neg liver ROS, GERD  Medicated and Controlled,  Endo/Other  Hypothyroidism   Renal/GU negative Renal ROS     Musculoskeletal negative musculoskeletal ROS (+)   Abdominal   Peds  Hematology negative hematology ROS (+)   Anesthesia Other Findings   Reproductive/Obstetrics                             Anesthesia Physical Anesthesia Plan  ASA: II  Anesthesia Plan: General   Post-op Pain Management:    Induction: Intravenous  PONV Risk Score and Plan: 4 or greater and Ondansetron, Dexamethasone, Propofol infusion and TIVA  Airway Management Planned: LMA  Additional Equipment: None  Intra-op Plan:   Post-operative Plan: Extubation in OR  Informed Consent: I have reviewed the patients History and Physical, chart, labs and discussed the procedure including the risks, benefits and alternatives for the proposed anesthesia with the patient or authorized representative who has indicated his/her understanding and acceptance.     Dental advisory given  Plan Discussed with: CRNA and Surgeon  Anesthesia Plan Comments:         Anesthesia Quick Evaluation

## 2019-06-09 NOTE — H&P (Signed)
Emily Clark  Location: [facility] Surgery Patient #: [id number] DOB: 10-18-1961 Single / Language: [name] / Race: White Female   History of Present Illness  The patient is a 58 year old female who presents with a breast mass. We are asked to see the patient in consultation by Dr. 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 The patient is a 57 year old white female who recently went for a routine screening mammogram. At that time she was found to have a 5 mm area of abnormal calcification in the lower outer left breast. This was biopsied and came back as atypical duct hyperplasia. She is scheduled to go back on Friday to have a clip placed as this was not done at the time of the biopsy. She denies any significant family history of breast cancer. She does have a daughter who had a glioblastoma. She quit smoking about 4 years ago.   Past Surgical History Gallbladder Surgery - Laparoscopic   Diagnostic Studies History  Mammogram  within last year  Allergies  Penicillins  Shortness of breath, Anaphylaxis. Palpitations, stomach cramps Amoxicillin *PENICILLINS*   Medication History  Metoprolol Tartrate (25MG  Tablet, Oral) Active. Synthroid (100MCG Tablet, Oral) Active. Calcium Citrate (950MG  Tablet, Oral) Active. Magnesium (500MG  Tablet, Oral) Active. Zinc (100MG  Tablet, Oral) Active. Fish Oil (1000MG  Capsule DR, Oral) Active. Melatonin (3MG  Capsule, Oral) Active. Vitamin D3 (250 MCG(10000 UT) Capsule, Oral) Active. Medications Reconciled  Social History  Alcohol use  Occasional alcohol use. Caffeine use  Coffee. No drug use  Tobacco use  Former smoker.  Family History  Arthritis  Mother. Depression  Mother. Hypertension  Father. Thyroid problems  Mother.  Pregnancy / Birth History  Age at menarche  67 years. Age of menopause  70-50  Other Problems  Bladder Problems  Cholelithiasis   Gastroesophageal Reflux Disease  High blood pressure  Hypercholesterolemia  Thyroid Disease     Review of Systems  General Not Present- Appetite Loss, Chills, Fatigue, Fever, Night Sweats, Weight Gain and Weight Loss. Note: All other systems negative (unless as noted in HPI & included Review of Systems) Skin Not Present- Change in Wart/Mole, Dryness, Hives, Jaundice, New Lesions, Non-Healing Wounds, Rash and Ulcer. HEENT Not Present- Earache, Hearing Loss, Hoarseness, Nose Bleed, Oral Ulcers, Ringing in the Ears, Seasonal Allergies, Sinus Pain, Sore Throat, Visual Disturbances, Wears glasses/contact lenses and Yellow Eyes. Respiratory Not Present- Bloody sputum, Chronic Cough, Difficulty Breathing, Snoring and Wheezing. Breast Not Present- Breast Mass, Breast Pain, Nipple Discharge and Skin Changes. Cardiovascular Not Present- Chest Pain, Difficulty Breathing Lying Down, Leg Cramps, Palpitations, Rapid Heart Rate, Shortness of Breath and Swelling of Extremities. Gastrointestinal Not Present- Abdominal Pain, Bloating, Bloody Stool, Change in Bowel Habits, Chronic diarrhea, Constipation, Difficulty Swallowing, Excessive gas, Gets full quickly at meals, Hemorrhoids, Indigestion, Nausea, Rectal Pain and Vomiting. Female Genitourinary Not Present- Frequency, Nocturia, Painful Urination, Pelvic Pain and Urgency. Musculoskeletal Not Present- Back Pain, Joint Pain, Joint Stiffness, Muscle Pain, Muscle Weakness and Swelling of Extremities. Neurological Not Present- Decreased Memory, Fainting, Headaches, Numbness, Seizures, Tingling, Tremor, Trouble walking and Weakness. Psychiatric Not Present- Anxiety, Bipolar, Change in Sleep Pattern, Depression, Fearful and Frequent crying. Endocrine Not Present- Cold Intolerance, Excessive Hunger, Hair Changes, Heat Intolerance, Hot flashes and New Diabetes. Hematology Not Present- Easy Bruising, Excessive bleeding, Gland problems, HIV and Persistent  Infections.  Vitals  Weight: 155.38 lb Height: 61in Body Surface Area: 1.7 m Body Mass Index: 29.36 kg/m  Temp.: 98.29F  Pulse: 85 (  Regular)  P.OX: 98% (Room air) BP: 130/92(Sitting, Left Arm, Standard)    Physical Exam  General Mental Status-Alert. General Appearance-Consistent with stated age. Hydration-Well hydrated. Voice-Normal.  Head and Neck Head-normocephalic, atraumatic with no lesions or palpable masses. Trachea-midline. Thyroid Gland Characteristics - normal size and consistency.  Eye Eyeball - Bilateral-Extraocular movements intact. Sclera/Conjunctiva - Bilateral-No scleral icterus.  Chest and Lung Exam Chest and lung exam reveals -quiet, even and easy respiratory effort with no use of accessory muscles and on auscultation, normal breath sounds, no adventitious sounds and normal vocal resonance. Inspection Chest Wall - Normal. Back - normal.  Breast Note: There is a palpable bruise in the lower outer left breast. Other than this there is no other palpable mass in either breast. There is no palpable axillary, supraclavicular, or cervical lymphadenopathy.   Cardiovascular Cardiovascular examination reveals -normal heart sounds, regular rate and rhythm with no murmurs and normal pedal pulses bilaterally.  Abdomen Inspection Inspection of the abdomen reveals - No Hernias. Skin - Scar - no surgical scars. Palpation/Percussion Palpation and Percussion of the abdomen reveal - Soft, Non Tender, No Rebound tenderness, No Rigidity (guarding) and No hepatosplenomegaly. Auscultation Auscultation of the abdomen reveals - Bowel sounds normal.  Neurologic Neurologic evaluation reveals -alert and oriented x 3 with no impairment of recent or remote memory. Mental Status-Normal.  Musculoskeletal Normal Exam - Left-Upper Extremity Strength Normal and Lower Extremity Strength Normal. Normal Exam - Right-Upper Extremity Strength  Normal and Lower Extremity Strength Normal.  Lymphatic Head & Neck  General Head & Neck Lymphatics: Bilateral - Description - Normal. Axillary  General Axillary Region: Bilateral - Description - Normal. Tenderness - Non Tender. Femoral & Inguinal  Generalized Femoral & Inguinal Lymphatics: Bilateral - Description - Normal. Tenderness - Non Tender.    Assessment & Plan   ATYPICAL DUCTAL HYPERPLASIA OF LEFT BREAST (N60.92) Impression: The patient appears to have a 5 mm area of atypical ductal hyperplasia of the lower outer left breast. Because this is considered a high risk lesion and increases her risk of breast cancer to between 30 and 35% and because it can have an appearance similar to ductal carcinoma in situ I would recommend that this area be removed. She would also like to have this done. I have discussed with her in detail the risks and benefits of the operation as well as somewhat technical aspects and she understands and wishes to proceed. I will plan for a left breast radioactive seed localized lumpectomy. I will also refer her to the high risk clinic at the cancer center to talk about risk reduction.  Current Plans Referred to Oncology, for evaluation and follow up (Oncology). Routine.

## 2019-06-10 ENCOUNTER — Encounter (HOSPITAL_BASED_OUTPATIENT_CLINIC_OR_DEPARTMENT_OTHER): Payer: Self-pay | Admitting: General Surgery

## 2019-06-10 NOTE — Anesthesia Postprocedure Evaluation (Signed)
Anesthesia Post Note  Patient: CLEO SANTUCCI  Procedure(s) Performed: LEFT BREAST LUMPECTOMY WITH RADIOACTIVE SEED LOCALIZATION (Left Breast)     Patient location during evaluation: PACU Anesthesia Type: General Level of consciousness: awake and alert Pain management: pain level controlled Vital Signs Assessment: post-procedure vital signs reviewed and stable Respiratory status: spontaneous breathing, nonlabored ventilation, respiratory function stable and patient connected to nasal cannula oxygen Cardiovascular status: blood pressure returned to baseline and stable Postop Assessment: no apparent nausea or vomiting Anesthetic complications: no    Last Vitals:  Vitals:   06/09/19 1200 06/09/19 1215  BP: 132/75 (!) 151/87  Pulse: 87 72  Resp: 12 18  Temp: 36.6 C 36.7 C  SpO2: 98% 97%    Last Pain:  Vitals:   06/09/19 1215  TempSrc:   PainSc: 1                  Nijah Orlich

## 2019-07-28 ENCOUNTER — Ambulatory Visit (INDEPENDENT_AMBULATORY_CARE_PROVIDER_SITE_OTHER): Payer: Medicaid Other | Admitting: Urology

## 2019-07-28 DIAGNOSIS — N3946 Mixed incontinence: Secondary | ICD-10-CM | POA: Diagnosis not present

## 2019-08-20 ENCOUNTER — Telehealth: Payer: Self-pay | Admitting: Oncology

## 2019-08-20 ENCOUNTER — Other Ambulatory Visit: Payer: Self-pay | Admitting: *Deleted

## 2019-08-20 DIAGNOSIS — Z1239 Encounter for other screening for malignant neoplasm of breast: Secondary | ICD-10-CM

## 2019-08-20 DIAGNOSIS — Z9189 Other specified personal risk factors, not elsewhere classified: Secondary | ICD-10-CM

## 2019-08-20 NOTE — Telephone Encounter (Signed)
Returned patient phone call regarding cancelling an appointment, per patient request appointment has been cancelled.

## 2019-08-23 ENCOUNTER — Inpatient Hospital Stay: Payer: Medicaid Other | Admitting: Oncology

## 2019-08-23 ENCOUNTER — Inpatient Hospital Stay: Payer: Medicaid Other

## 2019-08-25 ENCOUNTER — Ambulatory Visit (INDEPENDENT_AMBULATORY_CARE_PROVIDER_SITE_OTHER): Payer: Medicaid Other | Admitting: Urology

## 2019-08-25 DIAGNOSIS — N3946 Mixed incontinence: Secondary | ICD-10-CM

## 2020-01-26 IMAGING — MG MM CLIP PLACEMENT
6 of 10 series · 6 of 30 positions shown · non-contrast
Comparison: Previous exam(s).

CLINICAL DATA: Stereotactic biopsy performed on 02/18/2019
revealing ADH in the lower outer quadrant of the LEFT breast.
Patient returns today for clip localization.

EXAM:
DIAGNOSTIC LEFT MAMMOGRAM POST STEREOTACTIC BIOPSY

[L CC synth-2D]
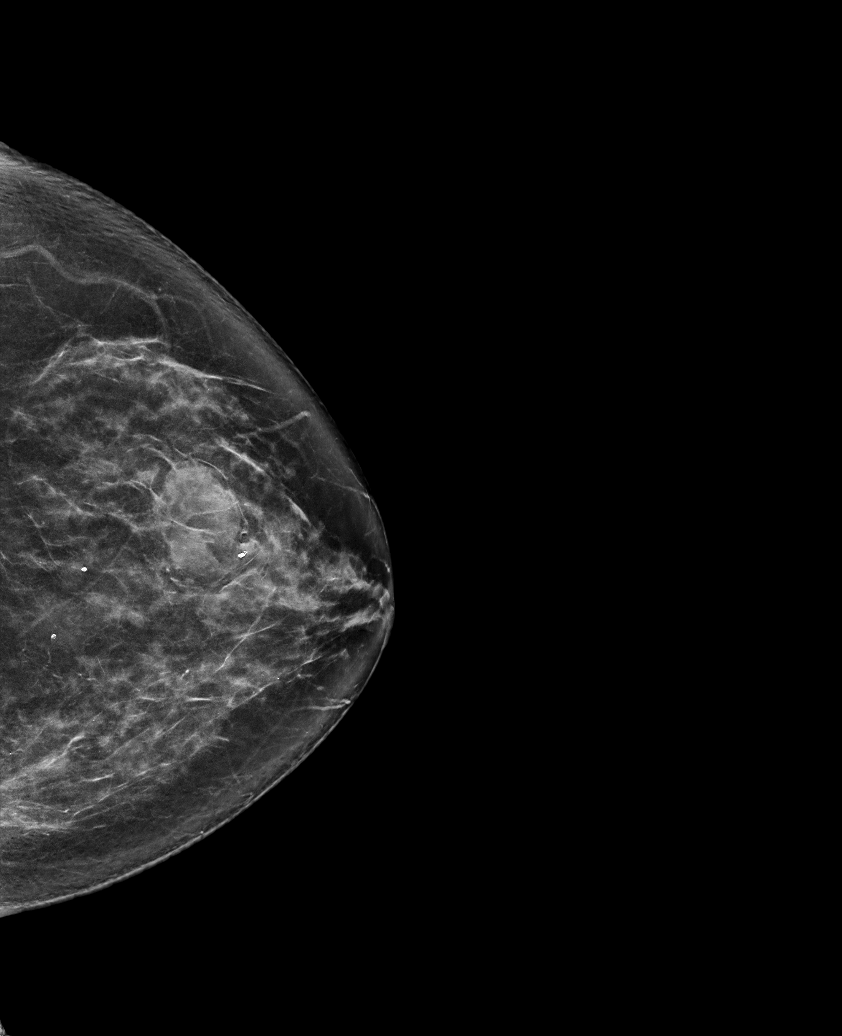

[L LM synth-2D (1 of 4)]
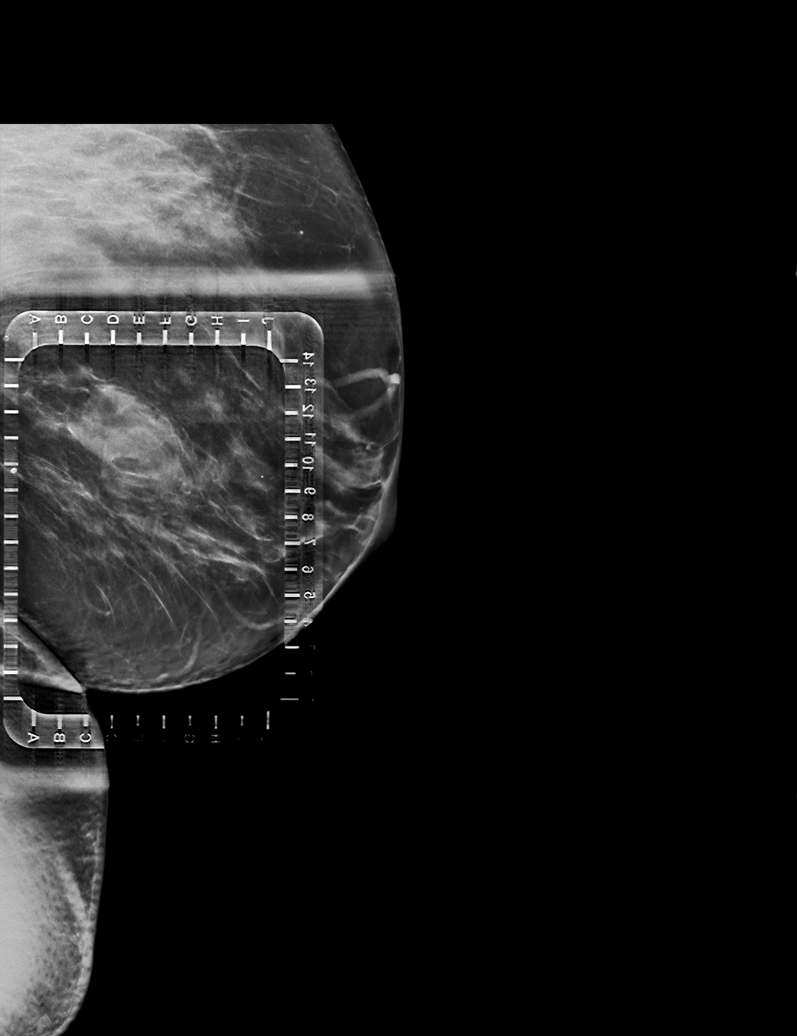

[L LM synth-2D (2 of 4)]
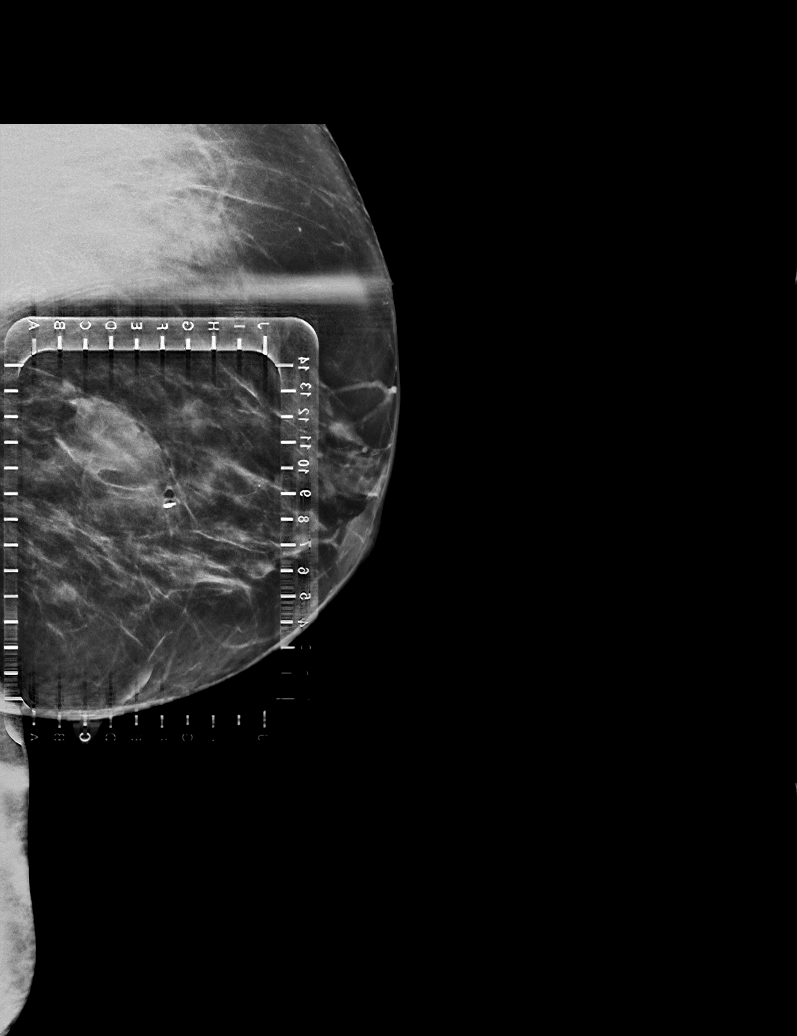

[L LM synth-2D (3 of 4)]
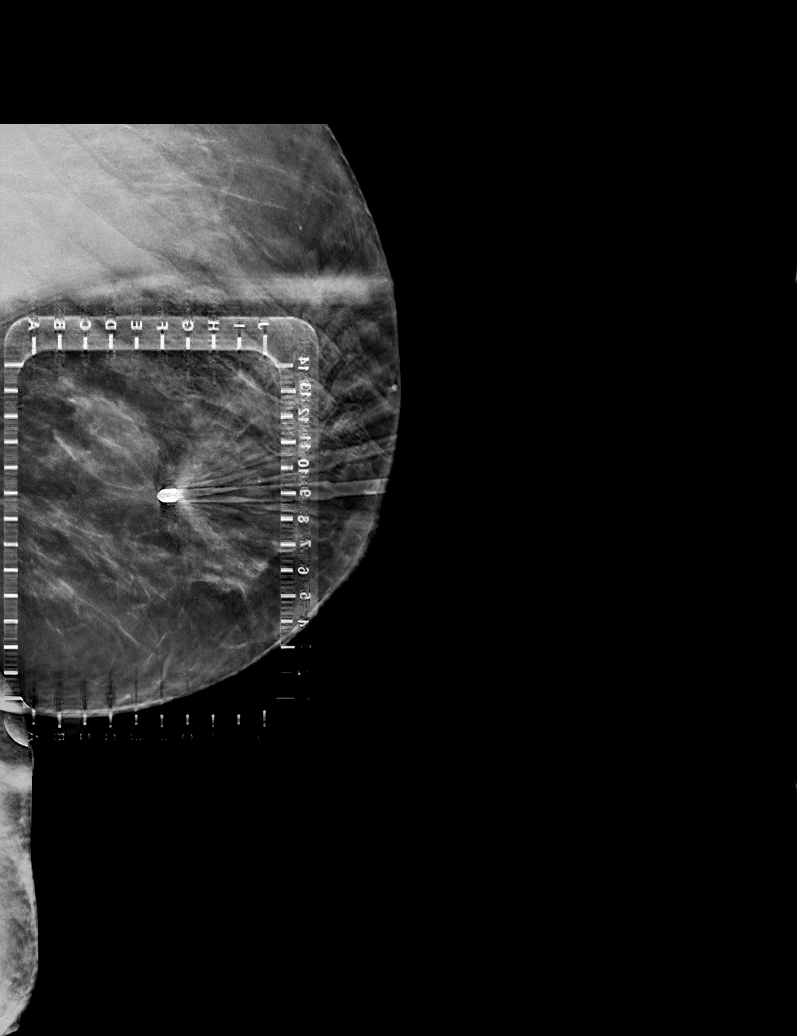

[L LM synth-2D (4 of 4)]
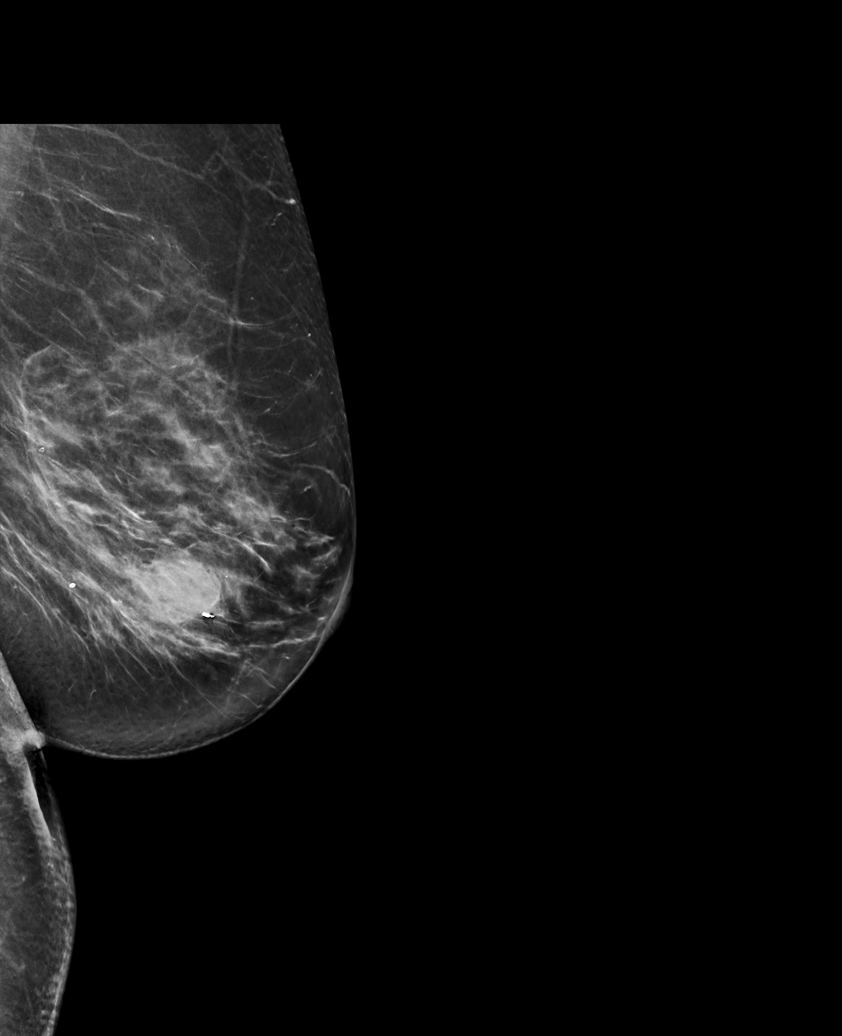

[L CC tomo · tomo slice 40/79.0]
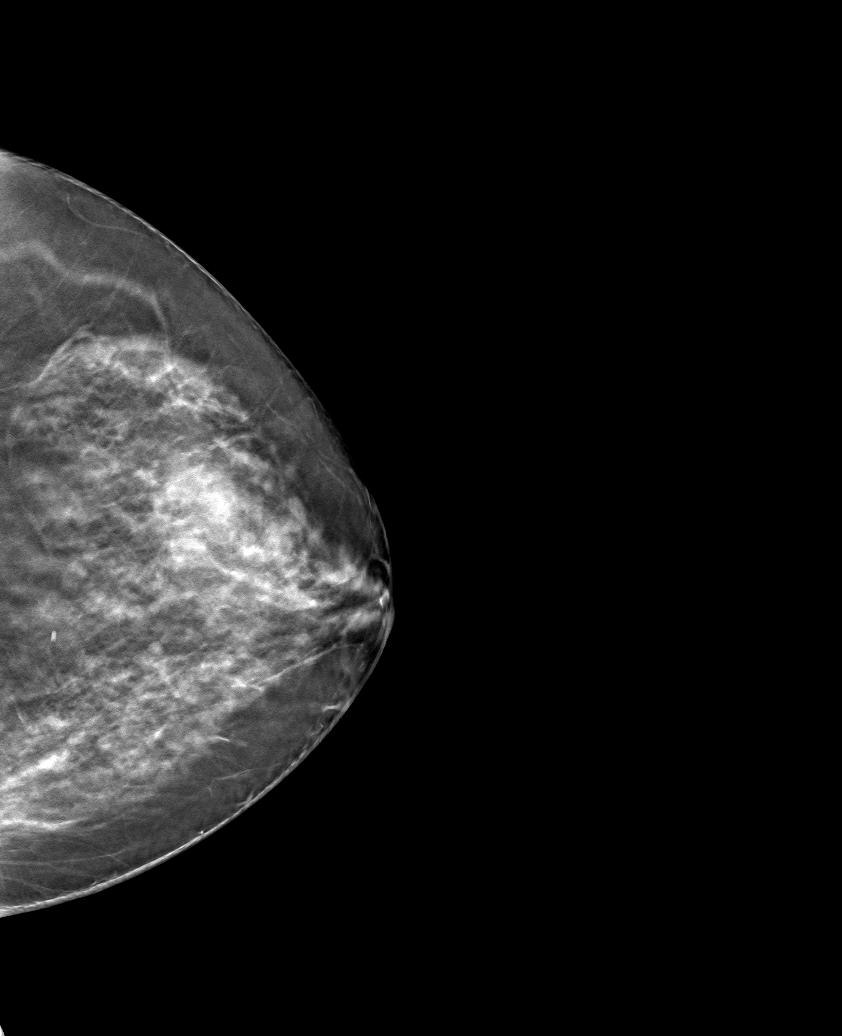

[6 of 30 positions shown; findings below may reference images not displayed]

FINDINGS: Mammographic images were obtained today following stereotactic
guided biopsy on 02/18/2019 of indeterminate calcifications in the
lower outer quadrant of the LEFT breast. At the time of the biopsy
on 02/18/2019, patient jumped causing dislodgement of the biopsy
device from the breast precluding clip placement. Patient returns
today for clip placement. Preprocedure exam shows a persistent
hematoma and several residual calcifications. Calcifications were
localized using a coil shaped clip.
IMPRESSION: Coil shaped clip is appropriately positioned at the site of a few
residual calcifications at the biopsy site within the lower outer
quadrant of the LEFT breast, at the anterior inferior margin of the
residual post biopsy hematoma.

Final Assessment: Post Procedure Mammograms for Marker Placement

## 2020-02-21 ENCOUNTER — Other Ambulatory Visit (HOSPITAL_COMMUNITY): Payer: Self-pay | Admitting: Sports Medicine

## 2020-02-21 DIAGNOSIS — Z1231 Encounter for screening mammogram for malignant neoplasm of breast: Secondary | ICD-10-CM

## 2020-04-03 ENCOUNTER — Other Ambulatory Visit (HOSPITAL_COMMUNITY): Payer: Self-pay | Admitting: Sports Medicine

## 2020-04-03 DIAGNOSIS — R921 Mammographic calcification found on diagnostic imaging of breast: Secondary | ICD-10-CM

## 2020-04-11 ENCOUNTER — Ambulatory Visit (HOSPITAL_COMMUNITY): Payer: Medicaid Other

## 2020-04-11 ENCOUNTER — Encounter (HOSPITAL_COMMUNITY): Payer: Medicaid Other

## 2020-04-11 ENCOUNTER — Ambulatory Visit (HOSPITAL_COMMUNITY): Admission: RE | Admit: 2020-04-11 | Payer: Medicaid Other | Source: Ambulatory Visit

## 2020-04-18 ENCOUNTER — Encounter: Payer: Self-pay | Admitting: *Deleted

## 2020-04-19 ENCOUNTER — Other Ambulatory Visit (HOSPITAL_COMMUNITY): Payer: Self-pay | Admitting: Sports Medicine

## 2020-05-02 ENCOUNTER — Ambulatory Visit (HOSPITAL_COMMUNITY)
Admission: RE | Admit: 2020-05-02 | Discharge: 2020-05-02 | Disposition: A | Discharge status: Home / Self Care | Payer: Medicaid Other | Source: Ambulatory Visit | Attending: Sports Medicine | Admitting: Sports Medicine

## 2020-05-02 ENCOUNTER — Other Ambulatory Visit: Payer: Self-pay

## 2020-05-02 ENCOUNTER — Other Ambulatory Visit (HOSPITAL_COMMUNITY): Payer: Medicaid Other

## 2020-05-02 ENCOUNTER — Other Ambulatory Visit (HOSPITAL_COMMUNITY): Payer: Self-pay | Admitting: Sports Medicine

## 2020-05-02 DIAGNOSIS — R921 Mammographic calcification found on diagnostic imaging of breast: Secondary | ICD-10-CM | POA: Diagnosis not present

## 2020-05-09 ENCOUNTER — Telehealth: Payer: Self-pay | Admitting: *Deleted

## 2020-05-09 ENCOUNTER — Other Ambulatory Visit: Payer: Self-pay

## 2020-05-09 ENCOUNTER — Other Ambulatory Visit (HOSPITAL_COMMUNITY): Payer: Self-pay | Admitting: Sports Medicine

## 2020-05-09 ENCOUNTER — Ambulatory Visit (HOSPITAL_COMMUNITY)
Admission: RE | Admit: 2020-05-09 | Discharge: 2020-05-09 | Disposition: A | Discharge status: Home / Self Care | Payer: Medicaid Other | Source: Ambulatory Visit | Attending: Sports Medicine | Admitting: Sports Medicine

## 2020-05-09 DIAGNOSIS — R921 Mammographic calcification found on diagnostic imaging of breast: Secondary | ICD-10-CM

## 2020-05-09 MED ORDER — LIDOCAINE HCL (PF) 2 % IJ SOLN
INTRAMUSCULAR | Status: AC
  Filled 2020-05-09: qty 10

## 2020-05-09 MED ORDER — LIDOCAINE-EPINEPHRINE (PF) 1 %-1:200000 IJ SOLN
INTRAMUSCULAR | Status: AC
  Filled 2020-05-09: qty 30

## 2020-05-09 MED ORDER — SODIUM BICARBONATE 4.2 % IV SOLN
INTRAVENOUS | Status: AC
  Filled 2020-05-09: qty 10

## 2020-05-09 NOTE — Telephone Encounter (Signed)
Patient called and left voicemail stating she has a lump in her right breast. Attempted to call patient back. No one answered phone. Left voicemail for patient to call me back.

## 2020-05-10 ENCOUNTER — Telehealth: Payer: Self-pay | Admitting: *Deleted

## 2020-05-10 NOTE — Telephone Encounter (Signed)
Patient received her breast biopsy results from the [facility] contacted me to reach out to the patient. Called patient back and let her know that we will work on her [place] Medicaid since she is eligible for renewal. Let her know that we will check if a re-certification or a new application is needed. Told patient that either Sherie or I will follow up with her. Patient verbalized understanding.

## 2020-05-10 NOTE — Telephone Encounter (Signed)
Patient left me a voicemail in regards to a new breast lump. Called patient back. Patient received [employer] Medicaid through the [facility] program. Patient stated she had a diagnostic mammogram completed 05/02/20 and a biopsy yesterday. Let patient know that [employer] will not cover the imaging or biopsy due to her B[employer] is expired. Patient stated her Medicaid is still active but needs to be renewed. Let patient know that Medicaid should cover the cost of her imaging and biopsy if still active. Told patient that would not be able to renew her [place] Medicaid since she is not currently in active treatment. Informed patient that if her biopsy shows either cancer or atypical hyperplasia that we can renew her [employer] Medicaid. Patient stated she would call me if she is eligible for renewal. Patient verbalized understanding.

## 2020-05-27 LAB — TSH: TSH: 44.82 — AB (ref 0.41–5.90)

## 2020-06-06 LAB — SURGICAL PATHOLOGY

## 2020-06-26 ENCOUNTER — Ambulatory Visit: Payer: Self-pay | Admitting: General Surgery

## 2020-06-26 DIAGNOSIS — Z170 Estrogen receptor positive status [ER+]: Secondary | ICD-10-CM

## 2020-06-26 DIAGNOSIS — C50111 Malignant neoplasm of central portion of right female breast: Secondary | ICD-10-CM

## 2020-06-27 ENCOUNTER — Ambulatory Visit: Payer: Medicaid Other

## 2020-06-29 ENCOUNTER — Other Ambulatory Visit: Payer: Self-pay | Admitting: General Surgery

## 2020-06-29 DIAGNOSIS — C50111 Malignant neoplasm of central portion of right female breast: Secondary | ICD-10-CM

## 2020-07-10 NOTE — Progress Notes (Signed)
No Pharmacies Listed     Your procedure is scheduled on Friday, July 14, 2020.  Report to [facility] Main Entrance "A" at 12:45 P.M., and check in at the Admitting office.  Call this number if you have problems the morning of surgery:  [phone]  Call [phone] if you have any questions prior to your surgery date Monday-Friday 8am-4pm    Remember:  Do not eat after midnight the night before your surgery  You may drink clear liquids until 11:45 AM the morning of your surgery.   Clear liquids allowed are: Water, Non-Citrus Juices (without pulp), Carbonated Beverages, Clear Tea, Black Coffee Only, and Gatorade    Take these medicines the morning of surgery with A SIP OF WATER:  atenolol (TENORMIN) levothyroxine (SYNTHROID, LEVOTHROID)  As of today, STOP taking any Aspirin (unless otherwise instructed by your surgeon) Aleve, Naproxen, Ibuprofen, Motrin, Advil, Goody's, BC's, all herbal medications, fish oil, and all vitamins.                      Do not wear jewelry, make up, or nail polish            Do not wear lotions, powders, perfumes, or deodorant.            Do not shave 48 hours prior to surgery.            Do not bring valuables to the hospital.            [facility] is not responsible for any belongings or valuables.  Do NOT Smoke (Tobacco/Vaping) or drink Alcohol 24 hours prior to your procedure If you use a CPAP at night, you may bring all equipment for your overnight stay.   Contacts, glasses, dentures or bridgework may not be worn into surgery.      For patients admitted to the hospital, discharge time will be determined by your treatment team.   Patients discharged the day of surgery will not be allowed to drive home, and someone needs to stay with them for 24 hours.    Special instructions:   [facility]- Preparing For Surgery  Before surgery, you can play an important role. Because skin is not sterile, your skin needs to be as free of germs as  possible. You can reduce the number of germs on your skin by washing with CHG (chlorahexidine gluconate) Soap before surgery.  CHG is an antiseptic cleaner which kills germs and bonds with the skin to continue killing germs even after washing.    Oral Hygiene is also important to reduce your risk of infection.  Remember - BRUSH YOUR TEETH THE MORNING OF SURGERY WITH YOUR REGULAR TOOTHPASTE  Please do not use if you have an allergy to CHG or antibacterial soaps. If your skin becomes reddened/irritated stop using the CHG.  Do not shave (including legs and underarms) for at least 48 hours prior to first CHG shower. It is OK to shave your face.  Please follow these instructions carefully.   1. Shower the NIGHT BEFORE SURGERY and the MORNING OF SURGERY with CHG Soap.   2. If you chose to wash your hair, wash your hair first as usual with your normal shampoo.  3. After you shampoo, rinse your hair and body thoroughly to remove the shampoo.  4. Use CHG as you would any other liquid soap. You can apply CHG directly to the skin and wash gently with a scrungie or a clean washcloth.  5. Apply the CHG Soap to your body ONLY FROM THE NECK DOWN.  Do not use on open wounds or open sores. Avoid contact with your eyes, ears, mouth and genitals (private parts). Wash Face and genitals (private parts)  with your normal soap.   6. Wash thoroughly, paying special attention to the area where your surgery will be performed.  7. Thoroughly rinse your body with warm water from the neck down.  8. DO NOT shower/wash with your normal soap after using and rinsing off the CHG Soap.  9. Pat yourself dry with a CLEAN TOWEL.  10. Wear CLEAN PAJAMAS to bed the night before surgery  11. Place CLEAN SHEETS on your bed the night of your first shower and DO NOT SLEEP WITH PETS.   Day of Surgery: Wear Clean/Comfortable clothing the morning of surgery Do not apply any deodorants/lotions.   Remember to brush your teeth  WITH YOUR REGULAR TOOTHPASTE.   Please read over the following fact sheets that you were given.

## 2020-07-11 ENCOUNTER — Encounter (HOSPITAL_COMMUNITY): Payer: Self-pay

## 2020-07-11 ENCOUNTER — Other Ambulatory Visit: Payer: Self-pay

## 2020-07-11 ENCOUNTER — Encounter (HOSPITAL_COMMUNITY)
Admission: RE | Admit: 2020-07-11 | Discharge: 2020-07-11 | Disposition: A | Discharge status: Home / Self Care | Payer: Medicaid Other | Source: Ambulatory Visit | Attending: General Surgery | Admitting: General Surgery

## 2020-07-11 ENCOUNTER — Other Ambulatory Visit (HOSPITAL_COMMUNITY)
Admission: RE | Admit: 2020-07-11 | Discharge: 2020-07-11 | Disposition: A | Discharge status: Home / Self Care | Payer: Medicaid Other | Source: Ambulatory Visit | Attending: General Surgery | Admitting: General Surgery

## 2020-07-11 DIAGNOSIS — Z01818 Encounter for other preprocedural examination: Secondary | ICD-10-CM | POA: Insufficient documentation

## 2020-07-11 DIAGNOSIS — Z01812 Encounter for preprocedural laboratory examination: Secondary | ICD-10-CM | POA: Insufficient documentation

## 2020-07-11 DIAGNOSIS — Z20822 Contact with and (suspected) exposure to covid-19: Secondary | ICD-10-CM | POA: Insufficient documentation

## 2020-07-11 HISTORY — DX: Headache, unspecified: R51.9

## 2020-07-11 HISTORY — DX: Anxiety disorder, unspecified: F41.9

## 2020-07-11 HISTORY — DX: Malignant (primary) neoplasm, unspecified: C80.1

## 2020-07-11 LAB — CBC
HCT: 41.5 % (ref 36.0–46.0)
Hemoglobin: 13.3 g/dL (ref 12.0–15.0)
MCH: 29.7 pg (ref 26.0–34.0)
MCHC: 32 g/dL (ref 30.0–36.0)
MCV: 92.6 fL (ref 80.0–100.0)
Platelets: 337 10*3/uL (ref 150–400)
RBC: 4.48 MIL/uL (ref 3.87–5.11)
RDW: 13.2 % (ref 11.5–15.5)
WBC: 8.1 10*3/uL (ref 4.0–10.5)
nRBC: 0 % (ref 0.0–0.2)

## 2020-07-11 LAB — BASIC METABOLIC PANEL
Anion gap: 9 (ref 5–15)
BUN: 11 mg/dL (ref 6–20)
CO2: 26 mmol/L (ref 22–32)
Calcium: 9.5 mg/dL (ref 8.9–10.3)
Chloride: 102 mmol/L (ref 98–111)
Creatinine, Ser: 0.85 mg/dL (ref 0.44–1.00)
GFR calc Af Amer: >60 mL/min (ref >60)
GFR calc non Af Amer: >60 mL/min (ref >60)
Glucose, Bld: 105 mg/dL — ABNORMAL HIGH (ref 70–99)
Potassium: 4.4 mmol/L (ref 3.5–5.1)
Sodium: 137 mmol/L (ref 135–145)

## 2020-07-11 LAB — SARS CORONAVIRUS 2 (TAT 6-24 HRS): SARS Coronavirus 2: NEGATIVE

## 2020-07-11 MED ORDER — CHLORHEXIDINE GLUCONATE CLOTH 2 % EX PADS: 6.0000 | MEDICATED_PAD | Freq: Once | CUTANEOUS | Status: DC

## 2020-07-11 NOTE — Progress Notes (Signed)
PCP - DR Marlon Pel IN [place]     MD AWARE OF SURGERY Cardiologist - NA  Chest x-ray - NA EKG - FOR TODAY Stress Test -NA  ECHO - NA Cardiac Cath - NA   in Instructions:STOP  ERAS Protcol -CLEAR DRINKS   Anesthesia review: HTN  Patient denies shortness of breath, fever, cough and chest pain at PAT appointment   All instructions explained to the patient, with a verbal understanding of the material. Patient agrees to go over the instructions while at home for a better understanding. Patient also instructed to self quarantine after being tested for COVID-19. The opportunity to ask questions was provided.

## 2020-07-12 NOTE — Progress Notes (Signed)
Anesthesia Chart Review:  Case: [id number] Date/Time: 07/14/20 1430   Procedure: RIGHT BREAST LUMPECTOMY WITH RADIOACTIVE SEED AND SENTINEL LYMPH NODE BIOPSY (Right Breast)   Anesthesia type: General   Pre-op diagnosis: RIGHT BREAST CANCER   Location: [facility] OR ROOM 02 / [facility]   Surgeons: Jovita Kussmaul, MD      DISCUSSION: Patient is a 59 year old female scheduled for the above procedure.   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   07/11/2020 presurgical COVID-19 test negative.  Anesthesia team to evaluate on the day of surgery. RSL 07/13/20.    VS: BP 130/76   Pulse 72   Temp 36.7 C (Oral)   Resp 18   Ht 5\' 2"  (1.575 m)   Wt 71.3 kg   SpO2 97%   BMI 28.75 kg/m    PROVIDERS: Wyatt Haste, NP is listed as PCP, reported as Dr. Marlon Pel in [place]. Oncologist lists primary through [facility].   - Wanita Chamberlain, MD is HEM-ONC. Visit 06/12/20 ([place]). Langston Reusing, MD is RAD-ONC. Visit 07/03/20 ([place]).   LABS: Labs reviewed: Acceptable for surgery. (all labs ordered are listed, but only abnormal results are displayed)  Labs Reviewed  BASIC METABOLIC PANEL - Abnormal; Notable for the following components:      Result Value   Glucose, Bld 105 (*)    All other components within normal limits  CBC    EKG: 07/11/20: Normal sinus rhythm with sinus arrhythmia Normal ECG Confirmed by Loralie Champagne ([id number]) on 07/11/2020 12:04:57 PM   CV: N/A  Past Medical History:  Diagnosis Date  . Anxiety   . Cancer ([place])   . Complication of anesthesia   . Family history of brain cancer   . Family history of breast cancer   . Family history of ovarian cancer   . Family history of  prostate cancer   . GERD (gastroesophageal reflux disease)   . Headache   . Hyperlipidemia   . Hypertension   . Hypothyroidism   . Left breast mass   . PONV (postoperative nausea and vomiting)   . Thyroid disease     Past Surgical History:  Procedure Laterality Date  . BREAST LUMPECTOMY WITH RADIOACTIVE SEED LOCALIZATION Left 06/09/2019   Procedure: LEFT BREAST LUMPECTOMY WITH RADIOACTIVE SEED LOCALIZATION;  Surgeon: Jovita Kussmaul, MD;  Location: [facility];  Service: General;  Laterality: Left;  . BREAST SURGERY    . CHOLECYSTECTOMY    . finger reattachment Right    third  . HEMORRHOID SURGERY    . TUBAL LIGATION    . tummy tuck      MEDICATIONS: . atenolol (TENORMIN) 50 MG tablet  . Cholecalciferol (D3 ADULT PO)  . esomeprazole (NEXIUM) 40 MG capsule  . HYDROcodone-acetaminophen (NORCO/VICODIN) 5-325 MG tablet  . levothyroxine (SYNTHROID, LEVOTHROID) 100 MCG tablet  . MELATONIN PO  . metoprolol tartrate (LOPRESSOR) 25 MG tablet  . Multiple Vitamin (MULTIVITAMIN) tablet  . Omega 3-6-9 Fatty Acids (TRIPLE OMEGA COMPLEX PO)  . spironolactone (ALDACTONE) 25 MG tablet  . traMADol (ULTRAM) 50 MG tablet  . tretinoin (RETIN-A) 0.05 % cream   No current facility-administered medications for this encounter.    Myra Gianotti, PA-C Surgical Short Stay/Anesthesiology [facility] Phone [phone] [facility] Phone [phone]  07/12/2020 3:34 PM

## 2020-07-12 NOTE — Anesthesia Preprocedure Evaluation (Addendum)
Anesthesia Evaluation  Patient identified by MRN, date of birth, ID band Patient awake    Reviewed: Allergy & Precautions, NPO status , Patient's Chart, lab work & pertinent test results, reviewed documented beta blocker date and time   History of Anesthesia Complications (+) PONV and history of anesthetic complications  Airway Mallampati: II  TM Distance: >3 FB Neck ROM: Full    Dental  (+) Missing,    Pulmonary former smoker,    Pulmonary exam normal        Cardiovascular hypertension, Pt. on medications and Pt. on home beta blockers Normal cardiovascular exam     Neuro/Psych  Headaches, Anxiety    GI/Hepatic Neg liver ROS, GERD  Medicated and Controlled,  Endo/Other  Hypothyroidism   Renal/GU negative Renal ROS  negative genitourinary   Musculoskeletal negative musculoskeletal ROS (+)   Abdominal   Peds  Hematology negative hematology ROS (+)   Anesthesia Other Findings Day of surgery medications reviewed with patient.  Reproductive/Obstetrics negative OB ROS                            Anesthesia Physical Anesthesia Plan  ASA: II  Anesthesia Plan: General   Post-op Pain Management: GA combined w/ Regional for post-op pain   Induction: Intravenous  PONV Risk Score and Plan: 4 or greater and Treatment may vary due to age or medical condition, Ondansetron, Dexamethasone and Midazolam  Airway Management Planned: LMA  Additional Equipment: None  Intra-op Plan:   Post-operative Plan: Extubation in OR  Informed Consent: I have reviewed the patients History and Physical, chart, labs and discussed the procedure including the risks, benefits and alternatives for the proposed anesthesia with the patient or authorized representative who has indicated his/her understanding and acceptance.     Dental advisory given  Plan Discussed with: CRNA  Anesthesia Plan Comments: (PAT note  written 07/12/2020 by Myra Gianotti, PA-C. )       Anesthesia Quick Evaluation

## 2020-07-13 ENCOUNTER — Ambulatory Visit
Admission: RE | Admit: 2020-07-13 | Discharge: 2020-07-13 | Disposition: A | Discharge status: Home / Self Care | Payer: No Typology Code available for payment source | Source: Ambulatory Visit | Attending: General Surgery | Admitting: General Surgery

## 2020-07-13 ENCOUNTER — Other Ambulatory Visit: Payer: Self-pay

## 2020-07-13 DIAGNOSIS — C50111 Malignant neoplasm of central portion of right female breast: Secondary | ICD-10-CM

## 2020-07-14 ENCOUNTER — Other Ambulatory Visit: Payer: Self-pay

## 2020-07-14 ENCOUNTER — Encounter (HOSPITAL_COMMUNITY)
Admission: RE | Disposition: A | Discharge status: Home / Self Care | Payer: Self-pay | Source: Home / Self Care | Attending: General Surgery

## 2020-07-14 ENCOUNTER — Ambulatory Visit (HOSPITAL_COMMUNITY)
Admission: RE | Admit: 2020-07-14 | Discharge: 2020-07-14 | Disposition: A | Discharge status: Home / Self Care | Payer: Medicaid Other | Attending: General Surgery | Admitting: General Surgery

## 2020-07-14 ENCOUNTER — Encounter (HOSPITAL_COMMUNITY)
Admission: RE | Admit: 2020-07-14 | Discharge: 2020-07-14 | Disposition: A | Discharge status: Home / Self Care | Payer: Medicaid Other | Source: Ambulatory Visit | Attending: General Surgery | Admitting: General Surgery

## 2020-07-14 ENCOUNTER — Ambulatory Visit
Admission: RE | Admit: 2020-07-14 | Discharge: 2020-07-14 | Disposition: A | Discharge status: Home / Self Care | Payer: No Typology Code available for payment source | Source: Ambulatory Visit | Attending: General Surgery | Admitting: General Surgery

## 2020-07-14 ENCOUNTER — Encounter (HOSPITAL_COMMUNITY): Payer: Self-pay | Admitting: General Surgery

## 2020-07-14 ENCOUNTER — Ambulatory Visit (HOSPITAL_COMMUNITY): Payer: Medicaid Other | Admitting: Vascular Surgery

## 2020-07-14 ENCOUNTER — Ambulatory Visit (HOSPITAL_COMMUNITY): Payer: Medicaid Other | Admitting: Anesthesiology

## 2020-07-14 DIAGNOSIS — Z880 Allergy status to penicillin: Secondary | ICD-10-CM | POA: Insufficient documentation

## 2020-07-14 DIAGNOSIS — C50111 Malignant neoplasm of central portion of right female breast: Secondary | ICD-10-CM | POA: Insufficient documentation

## 2020-07-14 DIAGNOSIS — Z79899 Other long term (current) drug therapy: Secondary | ICD-10-CM | POA: Insufficient documentation

## 2020-07-14 DIAGNOSIS — I10 Essential (primary) hypertension: Secondary | ICD-10-CM | POA: Insufficient documentation

## 2020-07-14 DIAGNOSIS — Z170 Estrogen receptor positive status [ER+]: Secondary | ICD-10-CM | POA: Insufficient documentation

## 2020-07-14 DIAGNOSIS — E039 Hypothyroidism, unspecified: Secondary | ICD-10-CM | POA: Insufficient documentation

## 2020-07-14 DIAGNOSIS — Z79890 Hormone replacement therapy (postmenopausal): Secondary | ICD-10-CM | POA: Diagnosis not present

## 2020-07-14 DIAGNOSIS — Z87891 Personal history of nicotine dependence: Secondary | ICD-10-CM | POA: Diagnosis not present

## 2020-07-14 HISTORY — PX: BREAST LUMPECTOMY WITH RADIOACTIVE SEED AND SENTINEL LYMPH NODE BIOPSY: SHX6550

## 2020-07-14 SURGERY — BREAST LUMPECTOMY WITH RADIOACTIVE SEED AND SENTINEL LYMPH NODE BIOPSY
Anesthesia: General | Site: Breast | Laterality: Right

## 2020-07-14 MED ORDER — DEXAMETHASONE SODIUM PHOSPHATE 10 MG/ML IJ SOLN
INTRAMUSCULAR | Status: DC | PRN
  Administered 2020-07-14: 10 mg via INTRAVENOUS

## 2020-07-14 MED ORDER — PROMETHAZINE HCL 25 MG/ML IJ SOLN: 6.2500 mg | INTRAMUSCULAR | Status: DC | PRN

## 2020-07-14 MED ORDER — FENTANYL CITRATE (PF) 250 MCG/5ML IJ SOLN
INTRAMUSCULAR | Status: AC
  Filled 2020-07-14: qty 5

## 2020-07-14 MED ORDER — BUPIVACAINE HCL (PF) 0.25 % IJ SOLN
INTRAMUSCULAR | Status: AC
  Filled 2020-07-14: qty 30

## 2020-07-14 MED ORDER — LACTATED RINGERS IV SOLN: INTRAVENOUS | Status: DC

## 2020-07-14 MED ORDER — CELECOXIB 200 MG PO CAPS
200.0000 mg | ORAL_CAPSULE | ORAL | Status: AC
  Administered 2020-07-14: 200 mg via ORAL
  Filled 2020-07-14: qty 1

## 2020-07-14 MED ORDER — TRAMADOL HCL 50 MG PO TABS: 50.0000 mg | ORAL_TABLET | Freq: Four times a day (QID) | ORAL | 1 refills | Status: DC | PRN

## 2020-07-14 MED ORDER — FENTANYL CITRATE (PF) 100 MCG/2ML IJ SOLN
INTRAMUSCULAR | Status: AC
  Filled 2020-07-14: qty 2

## 2020-07-14 MED ORDER — ORAL CARE MOUTH RINSE: 15.0000 mL | Freq: Once | OROMUCOSAL | Status: AC

## 2020-07-14 MED ORDER — ROCURONIUM BROMIDE 10 MG/ML (PF) SYRINGE
PREFILLED_SYRINGE | INTRAVENOUS | Status: DC | PRN
  Administered 2020-07-14: 50 mg via INTRAVENOUS

## 2020-07-14 MED ORDER — ONDANSETRON HCL 4 MG/2ML IJ SOLN
INTRAMUSCULAR | Status: DC | PRN
  Administered 2020-07-14: 4 mg via INTRAVENOUS

## 2020-07-14 MED ORDER — BUPIVACAINE-EPINEPHRINE (PF) 0.5% -1:200000 IJ SOLN
INTRAMUSCULAR | Status: DC | PRN
  Administered 2020-07-14: 20 mL via PERINEURAL

## 2020-07-14 MED ORDER — MIDAZOLAM HCL 2 MG/2ML IJ SOLN
INTRAMUSCULAR | Status: AC
  Administered 2020-07-14: 1 mg
  Filled 2020-07-14: qty 2

## 2020-07-14 MED ORDER — METHYLENE BLUE 0.5 % INJ SOLN
INTRAVENOUS | Status: AC
  Filled 2020-07-14: qty 10

## 2020-07-14 MED ORDER — FENTANYL CITRATE (PF) 250 MCG/5ML IJ SOLN
INTRAMUSCULAR | Status: DC | PRN
Start: 2020-07-14 — End: 2020-07-14
  Administered 2020-07-14: 100 ug via INTRAVENOUS

## 2020-07-14 MED ORDER — TECHNETIUM TC 99M SULFUR COLLOID FILTERED
1.0000 | Freq: Once | INTRAVENOUS | Status: AC | PRN
  Administered 2020-07-14: 1 via INTRADERMAL

## 2020-07-14 MED ORDER — ACETAMINOPHEN 500 MG PO TABS
1000.0000 mg | ORAL_TABLET | ORAL | Status: AC
  Administered 2020-07-14: 1000 mg via ORAL
  Filled 2020-07-14: qty 2

## 2020-07-14 MED ORDER — VANCOMYCIN HCL IN DEXTROSE 1-5 GM/200ML-% IV SOLN
1000.0000 mg | INTRAVENOUS | Status: AC
  Administered 2020-07-14: 1000 mg via INTRAVENOUS
  Filled 2020-07-14: qty 200

## 2020-07-14 MED ORDER — PHENYLEPHRINE HCL (PRESSORS) 10 MG/ML IV SOLN
INTRAVENOUS | Status: DC | PRN
  Administered 2020-07-14 (×3): 80 ug via INTRAVENOUS

## 2020-07-14 MED ORDER — 0.9 % SODIUM CHLORIDE (POUR BTL) OPTIME
TOPICAL | Status: DC | PRN
  Administered 2020-07-14: 1000 mL

## 2020-07-14 MED ORDER — LIDOCAINE HCL (CARDIAC) PF 100 MG/5ML IV SOSY
PREFILLED_SYRINGE | INTRAVENOUS | Status: DC | PRN
  Administered 2020-07-14: 80 mg via INTRAVENOUS

## 2020-07-14 MED ORDER — FENTANYL CITRATE (PF) 100 MCG/2ML IJ SOLN
INTRAMUSCULAR | Status: AC
  Administered 2020-07-14: 50 ug
  Filled 2020-07-14: qty 2

## 2020-07-14 MED ORDER — OXYCODONE HCL 5 MG PO TABS
5.0000 mg | ORAL_TABLET | Freq: Once | ORAL | Status: AC | PRN
  Administered 2020-07-14: 5 mg via ORAL

## 2020-07-14 MED ORDER — EPHEDRINE SULFATE-NACL 50-0.9 MG/10ML-% IV SOSY
PREFILLED_SYRINGE | INTRAVENOUS | Status: DC | PRN
  Administered 2020-07-14: 5 mg via INTRAVENOUS

## 2020-07-14 MED ORDER — SODIUM CHLORIDE (PF) 0.9 % IJ SOLN
INTRAMUSCULAR | Status: AC
  Filled 2020-07-14: qty 10

## 2020-07-14 MED ORDER — BUPIVACAINE HCL (PF) 0.25 % IJ SOLN
INTRAMUSCULAR | Status: DC | PRN
  Administered 2020-07-14: 25 mL

## 2020-07-14 MED ORDER — SOD CITRATE-CITRIC ACID 500-334 MG/5ML PO SOLN
30.0000 mL | Freq: Once | ORAL | Status: AC
  Filled 2020-07-14: qty 30

## 2020-07-14 MED ORDER — SUGAMMADEX SODIUM 200 MG/2ML IV SOLN
INTRAVENOUS | Status: DC | PRN
  Administered 2020-07-14: 150 mg via INTRAVENOUS

## 2020-07-14 MED ORDER — OXYCODONE HCL 5 MG PO TABS
ORAL_TABLET | ORAL | Status: AC
  Filled 2020-07-14: qty 1

## 2020-07-14 MED ORDER — SOD CITRATE-CITRIC ACID 500-334 MG/5ML PO SOLN
ORAL | Status: AC
  Filled 2020-07-14: qty 15

## 2020-07-14 MED ORDER — GABAPENTIN 300 MG PO CAPS
300.0000 mg | ORAL_CAPSULE | ORAL | Status: AC
  Administered 2020-07-14: 300 mg via ORAL
  Filled 2020-07-14: qty 1

## 2020-07-14 MED ORDER — CHLORHEXIDINE GLUCONATE 0.12 % MT SOLN: 15.0000 mL | Freq: Once | OROMUCOSAL | Status: AC

## 2020-07-14 MED ORDER — PROPOFOL 10 MG/ML IV BOLUS
INTRAVENOUS | Status: AC
  Filled 2020-07-14: qty 20

## 2020-07-14 MED ORDER — FENTANYL CITRATE (PF) 100 MCG/2ML IJ SOLN
25.0000 ug | INTRAMUSCULAR | Status: DC | PRN
  Administered 2020-07-14 (×2): 50 ug via INTRAVENOUS

## 2020-07-14 MED ORDER — SOD CITRATE-CITRIC ACID 500-334 MG/5ML PO SOLN
ORAL | Status: AC
  Administered 2020-07-14: 30 mL via ORAL
  Filled 2020-07-14: qty 15

## 2020-07-14 MED ORDER — PROPOFOL 10 MG/ML IV BOLUS
INTRAVENOUS | Status: DC | PRN
  Administered 2020-07-14: 140 mg via INTRAVENOUS

## 2020-07-14 MED ORDER — OXYCODONE HCL 5 MG/5ML PO SOLN: 5.0000 mg | Freq: Once | ORAL | Status: AC | PRN

## 2020-07-14 MED ORDER — CHLORHEXIDINE GLUCONATE 0.12 % MT SOLN
OROMUCOSAL | Status: AC
  Administered 2020-07-14: 15 mL via OROMUCOSAL
  Filled 2020-07-14: qty 15

## 2020-07-14 MED ORDER — BUPIVACAINE LIPOSOME 1.3 % IJ SUSP
INTRAMUSCULAR | Status: DC | PRN
  Administered 2020-07-14: 10 mL via PERINEURAL

## 2020-07-14 SURGICAL SUPPLY — 37 items
APPLIER CLIP 9.375 MED OPEN (MISCELLANEOUS) ×6
BINDER BREAST LRG (GAUZE/BANDAGES/DRESSINGS) ×3 IMPLANT
BINDER BREAST XLRG (GAUZE/BANDAGES/DRESSINGS) IMPLANT
CANISTER SUCT 3000ML PPV (MISCELLANEOUS) ×3 IMPLANT
CHLORAPREP W/TINT 26 (MISCELLANEOUS) IMPLANT
CLIP APPLIE 9.375 MED OPEN (MISCELLANEOUS) ×2 IMPLANT
CNTNR URN SCR LID CUP LEK RST (MISCELLANEOUS) ×2 IMPLANT
CONT SPEC 4OZ STRL OR WHT (MISCELLANEOUS) ×4
COVER PROBE W GEL 5X96 (DRAPES) ×3 IMPLANT
COVER SURGICAL LIGHT HANDLE (MISCELLANEOUS) ×3 IMPLANT
COVER WAND RF STERILE (DRAPES) IMPLANT
DERMABOND ADVANCED (GAUZE/BANDAGES/DRESSINGS) ×2
DERMABOND ADVANCED .7 DNX12 (GAUZE/BANDAGES/DRESSINGS) ×1 IMPLANT
DEVICE DUBIN SPECIMEN MAMMOGRA (MISCELLANEOUS) ×3 IMPLANT
DRAPE CHEST BREAST 15X10 FENES (DRAPES) ×3 IMPLANT
DRSG PAD ABDOMINAL 8X10 ST (GAUZE/BANDAGES/DRESSINGS) ×3 IMPLANT
DURAPREP 26ML APPLICATOR (WOUND CARE) ×3 IMPLANT
ELECT COATED BLADE 2.86 ST (ELECTRODE) ×3 IMPLANT
ELECT REM PT RETURN 9FT ADLT (ELECTROSURGICAL) ×3
ELECTRODE REM PT RTRN 9FT ADLT (ELECTROSURGICAL) ×1 IMPLANT
GLOVE BIO SURGEON STRL SZ7.5 (GLOVE) ×6 IMPLANT
GOWN STRL REUS W/ TWL LRG LVL3 (GOWN DISPOSABLE) ×2 IMPLANT
GOWN STRL REUS W/TWL LRG LVL3 (GOWN DISPOSABLE) ×4
KIT BASIN OR (CUSTOM PROCEDURE TRAY) ×3 IMPLANT
KIT MARKER MARGIN INK (KITS) ×3 IMPLANT
LIGHT WAVEGUIDE WIDE FLAT (MISCELLANEOUS) IMPLANT
NEEDLE 18GX1X1/2 (RX/OR ONLY) (NEEDLE) IMPLANT
NEEDLE FILTER BLUNT 18X 1/2SAF (NEEDLE)
NEEDLE FILTER BLUNT 18X1 1/2 (NEEDLE) IMPLANT
NEEDLE HYPO 25GX1X1/2 BEV (NEEDLE) ×3 IMPLANT
NS IRRIG 1000ML POUR BTL (IV SOLUTION) ×3 IMPLANT
PACK GENERAL/GYN (CUSTOM PROCEDURE TRAY) ×3 IMPLANT
SUT MNCRL AB 4-0 PS2 18 (SUTURE) ×6 IMPLANT
SUT VIC AB 3-0 SH 18 (SUTURE) ×3 IMPLANT
SYR CONTROL 10ML LL (SYRINGE) ×3 IMPLANT
TOWEL GREEN STERILE (TOWEL DISPOSABLE) ×3 IMPLANT
TOWEL GREEN STERILE FF (TOWEL DISPOSABLE) ×3 IMPLANT

## 2020-07-14 NOTE — Interval H&P Note (Signed)
History and Physical Interval Note:  07/14/2020 2:46 PM  Emily Clark  has presented today for surgery, with the diagnosis of RIGHT BREAST CANCER.  The various methods of treatment have been discussed with the patient and family. After consideration of risks, benefits and other options for treatment, the patient has consented to  Procedure(s): RIGHT BREAST LUMPECTOMY WITH RADIOACTIVE SEED AND SENTINEL LYMPH NODE BIOPSY (Right) as a surgical intervention.  The patient's history has been reviewed, patient examined, no change in status, stable for surgery.  I have reviewed the patient's chart and labs.  Questions were answered to the patient's satisfaction.     Autumn Messing III

## 2020-07-14 NOTE — Progress Notes (Signed)
Patient stated that "itching was better".

## 2020-07-14 NOTE — Progress Notes (Signed)
Vacomycin started at 1401 in SS.  Patient complained of itching, but not other symptoms.  No SOB, no redness, no hives/rash.  Slowed rate down from 200cc/hr to 150cc/hr.

## 2020-07-14 NOTE — Anesthesia Procedure Notes (Signed)
Procedure Name: Intubation Date/Time: 07/14/2020 3:06 PM Performed by: Shirlyn Goltz, CRNA Pre-anesthesia Checklist: Patient identified, Emergency Drugs available, Suction available and Patient being monitored Patient Re-evaluated:Patient Re-evaluated prior to induction Oxygen Delivery Method: Circle system utilized Preoxygenation: Pre-oxygenation with 100% oxygen Induction Type: IV induction Ventilation: Mask ventilation without difficulty Laryngoscope Size: Mac and 3 Grade View: Grade II Tube type: Oral Tube size: 7.5 mm Number of attempts: 1 Airway Equipment and Method: Stylet Placement Confirmation: ETT inserted through vocal cords under direct vision,  positive ETCO2 and breath sounds checked- equal and bilateral Secured at: 21 cm Tube secured with: Tape Dental Injury: Teeth and Oropharynx as per pre-operative assessment

## 2020-07-14 NOTE — H&P (Signed)
Emily Clark  Location: [facility] Surgery Patient #: [id number] DOB: 03/20/1961 Single / Language: Cleophus Clark / Race: White Female   History of Present Illness  The patient is a 59 year old female who presents for a follow-up for Breast cancer. 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 On her recent mammogram she was found to have a 6 mm mass with calcification in the central portion of the right breast. Her lymph nodes looked normal. 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 She did meet with medical oncology last year to discuss risk reduction but decided not to take anti-estrogens. She quit smoking about 6 years ago.   Allergies  Penicillins  Shortness of breath, Anaphylaxis. Palpitations, stomach cramps Amoxicillin *PENICILLINS*  Allergies Reconciled   Medication History  Atenolol (25MG Tablet, Oral) Active. Spironolactone (Oral) Specific strength unknown - Active. Synthroid (100MCG Tablet, Oral) Active. Calcium Citrate (950MG Tablet, Oral) Active. Magnesium (500MG Tablet, Oral) Active. Zinc (100MG Tablet, Oral) Active. Fish Oil (1000MG Capsule DR, Oral) Active. Melatonin (3MG Capsule, Oral) Active. Vitamin D3 (250 MCG(10000 UT) Capsule, Oral) Active. Medications Reconciled    Review of Systems  General Not Present- Appetite Loss, Chills, Fatigue, Fever, Night Sweats, Weight Gain and Weight Loss. Note: All other systems negative (unless as noted in HPI & included Review of Systems) Skin Not Present- Change in Wart/Mole, Dryness, Hives, Jaundice, New Lesions, Non-Healing Wounds, Rash and Ulcer. HEENT Not Present- Earache, Hearing Loss, Hoarseness, Nose Bleed, Oral Ulcers, Ringing in the Ears, Seasonal Allergies, Sinus Pain, Sore Throat, Visual Disturbances,  Wears glasses/contact lenses and Yellow Eyes. Respiratory Not Present- Bloody sputum, Chronic Cough, Difficulty Breathing, Snoring and Wheezing. Breast Not Present- Breast Mass, Breast Pain, Nipple Discharge and Skin Changes. Cardiovascular Not Present- Chest Pain, Difficulty Breathing Lying Down, Leg Cramps, Palpitations, Rapid Heart Rate, Shortness of Breath and Swelling of Extremities. Gastrointestinal Not Present- Abdominal Pain, Bloating, Bloody Stool, Change in Bowel Habits, Chronic diarrhea, Constipation, Difficulty Swallowing, Excessive gas, Gets full quickly at meals, Hemorrhoids, Indigestion, Nausea, Rectal Pain and Vomiting. Female Genitourinary Not Present- Frequency, Nocturia, Painful Urination, Pelvic Pain and Urgency. Musculoskeletal Not Present- Back Pain, Joint Pain, Joint Stiffness, Muscle Pain, Muscle Weakness and Swelling of Extremities. Neurological Not Present- Decreased Memory, Fainting, Headaches, Numbness, Seizures, Tingling, Tremor, Trouble walking and Weakness. Psychiatric Not Present- Anxiety, Bipolar, Change in Sleep Pattern, Depression, Fearful and Frequent crying. Endocrine Not Present- Cold Intolerance, Excessive Hunger, Hair Changes, Heat Intolerance, Hot flashes and New Diabetes. Hematology Not Present- Easy Bruising, Excessive bleeding, Gland problems, HIV and Persistent Infections.  Vitals  Weight: 158.38 lb Height: 61in Body Surface Area: 1.71 m Body Mass Index: 29.92 kg/m  Temp.: 97.19F  Pulse: 79 (Regular)        Physical Exam  General Mental Status-Alert. General Appearance-Consistent with stated age. Hydration-Well hydrated. Voice-Normal.  Head and Neck Head-normocephalic, atraumatic with no lesions or palpable masses. Trachea-midline. Thyroid Gland Characteristics - normal size and consistency.  Eye Eyeball - Bilateral-Extraocular movements intact. Sclera/Conjunctiva - Bilateral-No scleral icterus.  Chest  and Lung Exam Chest and lung exam reveals -quiet, even and easy respiratory effort with no use of accessory muscles and on auscultation, normal breath sounds, no adventitious sounds and normal vocal resonance. Inspection Chest Wall - Normal. Back - normal.  Breast Note: There is no palpable mass in either breast. There is no palpable axillary, supraclavicular, or cervical lymphadenopathy.   Cardiovascular Cardiovascular examination reveals -normal heart sounds, regular rate and rhythm with no murmurs and normal pedal pulses bilaterally.  Abdomen Inspection Inspection of the abdomen reveals - No Hernias. Skin - Scar - no surgical scars. Palpation/Percussion Palpation and Percussion of the abdomen reveal - Soft, Non Tender, No Rebound tenderness, No Rigidity (guarding) and No hepatosplenomegaly. Auscultation Auscultation of the abdomen reveals - Bowel sounds normal. Note: There is mild right upper quadrant tenderness but no palpable mass   Neurologic Neurologic evaluation reveals -alert and oriented x 3 with no impairment of recent or remote memory. Mental Status-Normal.  Musculoskeletal Normal Exam - Left-Upper Extremity Strength Normal and Lower Extremity Strength Normal. Normal Exam - Right-Upper Extremity Strength Normal and Lower Extremity Strength Normal.  Lymphatic Head & Neck  General Head & Neck Lymphatics: Bilateral - Description - Normal. Axillary  General Axillary Region: Bilateral - Description - Normal. Tenderness - Non Tender. Femoral & Inguinal  Generalized Femoral & Inguinal Lymphatics: Bilateral - Description - Normal. Tenderness - Non Tender.    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 I have discussed with her in detail the different  options for treatment and at this point she favors breast conservation which I think is very reasonable. She is also a good candidate for sentinel node biopsy. I have discussed with her in detail the risks and benefits of the operation as well as some of the technical aspects including the use of the seed for localization and she understands and wishes to proceed. She will meet with medical and radiation oncology in [facility] for adjuvant therapy. This patient encounter took 60 minutes today to perform the following: take history, perform exam, review outside records, interpret imaging, counsel the patient on their diagnosis and document encounter, findings & plan in the EHR Current Plans Referred to Oncology, for evaluation and follow up (Oncology). Routine.

## 2020-07-14 NOTE — Op Note (Signed)
07/14/2020  4:19 PM  PATIENT:  Emily Clark  59 y.o. female  PRE-OPERATIVE DIAGNOSIS:  RIGHT BREAST CANCER  POST-OPERATIVE DIAGNOSIS:  RIGHT BREAST CANCER  PROCEDURE:  Procedure(s): RIGHT BREAST LUMPECTOMY WITH RADIOACTIVE SEED LOCALIZATION AND DEEP RIGHT AXILLARY SENTINEL LYMPH NODE BIOPSY (Right)  SURGEON:  Surgeon(s) and Role:    * Jovita Kussmaul, MD - Primary  PHYSICIAN ASSISTANT:   ASSISTANTS: none   ANESTHESIA:   local and general  EBL:  minimal   BLOOD ADMINISTERED:none  DRAINS: none   LOCAL MEDICATIONS USED:  MARCAINE     SPECIMEN:  Source of Specimen:  right breast tissue with additional superior margin and sentinel node  DISPOSITION OF SPECIMEN:  PATHOLOGY  COUNTS:  YES  TOURNIQUET:  * No tourniquets in log *  DICTATION: .Dragon Dictation   After informed consent was obtained the patient was brought to the operating room and placed in the supine position on the operating table.  After adequate induction of general anesthesia the patient's right chest, breast, and axillary area were prepped with ChloraPrep, allowed to dry, and draped in usual sterile manner.  An appropriate timeout was performed.  Previously an I-125 seed was placed in the lower central right breast to mark an area of invasive breast cancer.  Also earlier in the day the patient underwent injection of 1 mCi of technetium sulfur colloid in the subareolar position on the right.  The neoprobe was first set to technetium and there was a good signal in the right axilla.  The area overlying this was infiltrated with quarter percent Marcaine.  A small transversely oriented incision was made with a 15 blade knife overlying the area of radioactivity.  The incision was carried through the skin and subcutaneous tissue sharply with the electrocautery until the deep right axillary space was entered.  Blunt hemostat dissection was directed by the neoprobe.  I was able to identify a hot lymph node.  This lymph node  was excised sharply with the electrocautery and the surrounding small vessels and lymphatics were controlled with clips.  There may have been more than 1 lymph node in this tissue that was removed.  Ex vivo counts on this node were approximately 500.  No other hot or palpable lymph nodes were identified in the right axilla.  Hemostasis was achieved using the Bovie electrocautery.  The deep layer of the wound was then closed with interrupted 3-0 Vicryl stitches.  The skin was closed with a running 4-0 Monocryl subcuticular stitch.  Attention was then turned to the right breast.  The neoprobe was set to I-125 in the area of radioactivity was readily identified in the lower central right breast.  The area around this was infiltrated with quarter percent Marcaine.  A curvilinear incision was then made along the lower edge of the areola of the right breast.  The incision was carried through the skin and subcutaneous tissue sharply with the electrocautery.  Dissection was then carried towards the radioactive seed under the direction of the neoprobe.  Once I more closely approached the radioactive seed I then removed a circular portion of breast tissue sharply with the electrocautery around the radioactive seed while checking the area of radioactivity frequently.  Once the specimen was removed it was oriented with the appropriate paint colors.  A specimen radiograph was obtained that showed the clip and seed to be near the center of the specimen.  The specimen was then sent to pathology for further evaluation.  Because of  the proximity of the signal to the superior margin I did remove an additional superior margin and marked it appropriately and sent it to the pathology.  Hemostasis was then achieved using the Bovie electrocautery.  The wound was irrigated with saline and infiltrated with more quarter percent Marcaine.  The deep layer of the wound was then closed with layers of interrupted 3-0 Vicryl stitches.  The skin  was then closed with interrupted 4-0 Monocryl subcuticular stitches.  Dermabond dressings were applied.  The patient tolerated the procedure well.  At the end of the case all needle sponge and instrument counts were correct.  The patient was then awakened and taken to recovery in stable condition.  PLAN OF CARE: Discharge to home after PACU  PATIENT DISPOSITION:  PACU - hemodynamically stable.   Delay start of Pharmacological VTE agent (>24hrs) due to surgical blood loss or risk of bleeding: not applicable

## 2020-07-14 NOTE — Anesthesia Procedure Notes (Signed)
Anesthesia Regional Block: Pectoralis block   Pre-Anesthetic Checklist: ,, timeout performed, Correct Patient, Correct Site, Correct Laterality, Correct Procedure, Correct Position, site marked, Risks and benefits discussed, pre-op evaluation,  At surgeon's request and post-op pain management  Laterality: Right  Prep: Maximum Sterile Barrier Precautions used, chloraprep       Needles:  Injection technique: Single-shot  Needle Type: Echogenic Stimulator Needle     Needle Length: 9cm  Needle Gauge: 22     Additional Needles:   Procedures:,,,, ultrasound used (permanent image in chart),,,,  Narrative:  Start time: 07/14/2020 1:40 PM End time: 07/14/2020 1:43 PM Injection made incrementally with aspirations every 5 mL.  Performed by: Personally  Anesthesiologist: Brennan Bailey, MD  Additional Notes: Risks, benefits, and alternative discussed. Patient gave consent for procedure. Patient prepped and draped in sterile fashion. Sedation administered, patient remains easily responsive to voice. Relevant anatomy identified with ultrasound guidance. Local anesthetic given in 5cc increments with no signs or symptoms of intravascular injection. No pain or paraesthesias with injection. Patient monitored throughout procedure with signs of LAST or immediate complications. Tolerated well. Ultrasound image placed in chart.  Tawny Asal, MD

## 2020-07-14 NOTE — Transfer of Care (Signed)
Immediate Anesthesia Transfer of Care Note  Patient: Emily Clark  Procedure(s) Performed: RIGHT BREAST LUMPECTOMY WITH RADIOACTIVE SEED AND SENTINEL LYMPH NODE BIOPSY (Right Breast)  Patient Location: PACU  Anesthesia Type:General  Level of Consciousness: awake, alert , oriented and patient cooperative  Airway & Oxygen Therapy: Patient Spontanous Breathing and Patient connected to nasal cannula oxygen  Post-op Assessment: Report given to RN and Post -op Vital signs reviewed and stable  Post vital signs: Reviewed and stable  Last Vitals:  Vitals Value Taken Time  BP 136/69 07/14/20 1632  Temp 36.6 C 07/14/20 1632  Pulse 85 07/14/20 1633  Resp 14 07/14/20 1633  SpO2 96 % 07/14/20 1633  Vitals shown include unvalidated device data.  Last Pain:  Vitals:   07/14/20 1410  TempSrc:   PainSc: 0-No pain      Patients Stated Pain Goal: 3 (49/[id number])  Complications: No complications documented.

## 2020-07-16 NOTE — Anesthesia Postprocedure Evaluation (Signed)
Anesthesia Post Note  Patient: Emily Clark  Procedure(s) Performed: RIGHT BREAST LUMPECTOMY WITH RADIOACTIVE SEED AND SENTINEL LYMPH NODE BIOPSY (Right Breast)     Patient location during evaluation: PACU Anesthesia Type: General Level of consciousness: awake and alert and oriented Pain management: pain level controlled Vital Signs Assessment: post-procedure vital signs reviewed and stable Respiratory status: spontaneous breathing, nonlabored ventilation and respiratory function stable Cardiovascular status: blood pressure returned to baseline Postop Assessment: no apparent nausea or vomiting Anesthetic complications: no   No complications documented.  Last Vitals:  Vitals:   07/14/20 1717 07/14/20 1732  BP: 134/80 138/69  Pulse: 74 83  Resp: 12 12  Temp:  36.6 C  SpO2: 99% 94%    Last Pain:  Vitals:   07/14/20 1732  TempSrc:   PainSc: 2    Pain Goal: Patients Stated Pain Goal: 3 (07/14/20 1410)                 Brennan Bailey

## 2020-07-17 ENCOUNTER — Encounter (HOSPITAL_COMMUNITY): Payer: Self-pay | Admitting: General Surgery

## 2020-08-01 ENCOUNTER — Ambulatory Visit: Payer: Self-pay

## 2020-08-01 ENCOUNTER — Other Ambulatory Visit: Payer: Self-pay

## 2020-08-08 ENCOUNTER — Ambulatory Visit (INDEPENDENT_AMBULATORY_CARE_PROVIDER_SITE_OTHER): Payer: Medicaid Other | Admitting: Nurse Practitioner

## 2020-08-08 ENCOUNTER — Encounter: Payer: Self-pay | Admitting: Nurse Practitioner

## 2020-08-08 ENCOUNTER — Other Ambulatory Visit: Payer: Self-pay

## 2020-08-08 VITALS — BP 120/70 | HR 77 | Ht 61.0 in | Wt 155.8 lb

## 2020-08-08 DIAGNOSIS — E039 Hypothyroidism, unspecified: Secondary | ICD-10-CM | POA: Diagnosis not present

## 2020-08-08 NOTE — Patient Instructions (Signed)

## 2020-08-08 NOTE — Progress Notes (Signed)
Endocrinology Consult Note                                         08/08/2020, 3:56 PM   SUBJECTIVE:  Emily Clark is a 59 y.o.-year-old female patient being seen in consultation for hypothyroidism referred by Leonie Douglas, MD.   Past Medical History:  Diagnosis Date  . Anxiety   . Cancer ([place])   . Complication of anesthesia   . Family history of brain cancer   . Family history of breast cancer   . Family history of ovarian cancer   . Family history of prostate cancer   . GERD (gastroesophageal reflux disease)   . Headache   . Hyperlipidemia   . Hypertension   . Hypothyroidism   . Left breast mass   . PONV (postoperative nausea and vomiting)    x 1 surgery in [place]  . Thyroid disease     Past Surgical History:  Procedure Laterality Date  . BREAST LUMPECTOMY WITH RADIOACTIVE SEED AND SENTINEL LYMPH NODE BIOPSY Right 07/14/2020   Procedure: RIGHT BREAST LUMPECTOMY WITH RADIOACTIVE SEED AND SENTINEL LYMPH NODE BIOPSY;  Surgeon: Jovita Kussmaul, MD;  Location: [facility];  Service: General;  Laterality: Right;  . BREAST LUMPECTOMY WITH RADIOACTIVE SEED LOCALIZATION Left 06/09/2019   Procedure: LEFT BREAST LUMPECTOMY WITH RADIOACTIVE SEED LOCALIZATION;  Surgeon: Jovita Kussmaul, MD;  Location: [facility];  Service: General;  Laterality: Left;  . BREAST SURGERY    . CHOLECYSTECTOMY    . finger reattachment Right    third  . HEMORRHOID SURGERY    . TUBAL LIGATION    . tummy tuck      Social History   Socioeconomic History  . Marital status: Single    Spouse name: Not on file  . Number of children: 2  . Years of education: Not on file  . Highest education level: Some college, no degree  Occupational History  . Not on file  Tobacco Use  . Smoking status: Former Smoker    Packs/day: 0.50    Years: 41.00    Pack years: 20.50    Quit date: 09/06/2015    Years since quitting: 4.9   . Smokeless tobacco: Never Used  Vaping Use  . Vaping Use: Never used  Substance and Sexual Activity  . Alcohol use: Yes    Comment: rare social  . Drug use: No  . Sexual activity: Not on file    Comment: BTL  Other Topics Concern  . Not on file  Social History Narrative  . Not on file   Social Determinants of Health   Financial Resource Strain:   . Difficulty of Paying Living Expenses: Not on file  Food Insecurity:   . Worried About Charity fundraiser in the Last Year: Not on file  . Ran Out of Food in the Last Year: Not on file  Transportation Needs:   . Lack  of Transportation (Medical): Not on file  . Lack of Transportation (Non-Medical): Not on file  Physical Activity:   . Days of Exercise per Week: Not on file  . Minutes of Exercise per Session: Not on file  Stress:   . Feeling of Stress : Not on file  Social Connections:   . Frequency of Communication with Friends and Family: Not on file  . Frequency of Social Gatherings with Friends and Family: Not on file  . Attends Religious Services: Not on file  . Active Member of Clubs or Organizations: Not on file  . Attends Archivist Meetings: Not on file  . Marital Status: Not on file    Family History  Problem Relation Age of Onset  . Stroke Mother 80  . Diabetes Mother   . Heart disease Father   . Coronary artery disease Father   . Brain cancer Daughter 19       glioblastoma  . Skin cancer Son   . Prostate cancer Maternal Uncle   . Breast cancer Paternal Aunt 85  . Ovarian cancer Paternal Aunt 65  . Prostate cancer Maternal Grandfather   . Stroke Paternal Grandmother   . Diabetes Paternal Grandfather   . Prostate cancer Maternal Uncle     Outpatient Encounter Medications as of 08/08/2020  Medication Sig  . atenolol (TENORMIN) 50 MG tablet Take 50 mg by mouth daily.  Marland Kitchen FLUoxetine (PROZAC) 10 MG capsule Take 10 mg by mouth daily.  Marland Kitchen levothyroxine (SYNTHROID, LEVOTHROID) 100 MCG tablet Take 1  tablet (100 mcg total) by mouth daily. (Patient taking differently: Take 100 mcg by mouth daily before breakfast. )  . Multiple Vitamin (MULTIVITAMIN) tablet Take 1 tablet by mouth 2 (two) times a week.   . spironolactone (ALDACTONE) 50 MG tablet Take 50 mg by mouth daily.  Marland Kitchen tretinoin (RETIN-A) 0.05 % cream Apply 1 application topically daily as needed (facial eczema).   . [DISCONTINUED] spironolactone (ALDACTONE) 25 MG tablet Take 25 mg by mouth daily.  . [DISCONTINUED] bismuth subsalicylate (PEPTO BISMOL) 262 MG/15ML suspension Take 30 mLs by mouth every 6 (six) hours as needed for indigestion.  . [DISCONTINUED] traMADol (ULTRAM) 50 MG tablet Take 1-2 tablets (50-100 mg total) by mouth every 6 (six) hours as needed.   No facility-administered encounter medications on file as of 08/08/2020.    ALLERGIES: Allergies  Allergen Reactions  . Penicillins Palpitations  . Codeine Itching  . Amoxicillin Palpitations  . Tetracyclines & Related Rash   VACCINATION STATUS:  There is no immunization history on file for this patient.   HPI    Emily Clark  is a patient with the above medical history. she was diagnosed with hyperthyroidism at approximate age of 59 years old, which required no antithyroid treatment, in fact she reports she was started on thyroid replacement hormone.   she was given various doses of Levothyroxine over the years, currently on 100 micrograms of the generic form Euthyrox.  She was once told by a doctor that she did not have any problem with her thyroid and was taken off of her hormone supplementation.  She reports she felt fine during that time when she wasn't taking any supplements, but when she moved to the area, her PCP told her she had hypothyroidism and restarted her on medication.  She reports that she has " felt bad" ever since. she reports compliance to this medication: but commonly will take this medication with her other medications instead of on an  empty  stomach.  She reports she had a thyroid ultrasound in 2015 when she lived in [place], [place].  Report not available for review.  I reviewed patient's thyroid tests:  Lab Results  Component Value Date   TSH 44.82 (A) 05/26/2020   TSH 4.941 (H) 04/03/2018   TSH 3.350 10/24/2017   TSH 13.41 (H) 01/28/2017     Pt describes: - weight gain - fatigue - dry skin - hair loss  Pt denies feeling nodules in neck, hoarseness, dysphagia/odynophagia, SOB with lying down.  she does have a family history of thyroid disorders in her mother and maternal grandmother (both hypothyroidism).  No family history of thyroid cancer. No history of radiation therapy to head or neck. No recent use of iodine supplements.  She denies use of Biotin containing supplements.  I reviewed her chart and she also has a history of anxiety, GERD, HLD, HTN, frequent headaches, and breast cancer (S/p lumpectomy bilaterally).   ROS:  Constitutional: + weight gain/loss, + fatigue, no subjective hyperthermia, no subjective hypothermia Eyes: no blurry vision, no xerophthalmia ENT: no sore throat, no nodules palpated in throat, no dysphagia/odynophagia, no hoarseness Cardiovascular: no Chest Pain, no Shortness of Breath, no palpitations, no leg swelling Respiratory: no cough, no SOB Gastrointestinal: no Nausea/Vomiting/Diarrhea Musculoskeletal: no muscle/joint aches Skin: no rashes Neurological: no tremors, no numbness, no tingling, no dizziness Psychiatric: no depression, no anxiety   OBJECTIVE:    BP 120/70 (BP Location: Right Arm, Patient Position: Sitting)   Pulse 77   Ht 5\' 1"  (1.549 m)   Wt 155 lb 12.8 oz (70.7 kg)   LMP  (LMP Unknown)   BMI 29.44 kg/m  Wt Readings from Last 3 Encounters:  08/08/20 155 lb 12.8 oz (70.7 kg)  07/14/20 157 lb (71.2 kg)  07/11/20 157 lb 3.2 oz (71.3 kg)   BP Readings from Last 3 Encounters:  08/08/20 120/70  07/14/20 138/69  07/11/20 130/76     Physical Exam-  Limited  Constitutional:  Body mass index is 29.44 kg/m. , not in acute distress, normal state of mind Eyes:  EOMI, no exophthalmos Neck: Supple Thyroid: No gross goiter Cardiovascular: RRR, no murmers, rubs, or gallops, no edema Respiratory: Adequate breathing efforts, no crackles, rales, rhonchi, or wheezing Musculoskeletal: no gross deformities, strength intact in all four extremities, no gross restriction of joint movements Skin:  no rashes, no hyperemia Neurological: no tremor with outstretched hands   CMP ( most recent) CMP     Component Value Date/Time   NA 137 07/11/2020 1112   K 4.4 07/11/2020 1112   CL 102 07/11/2020 1112   CO2 26 07/11/2020 1112   GLUCOSE 105 (H) 07/11/2020 1112   BUN 11 07/11/2020 1112   CREATININE 0.85 07/11/2020 1112   CREATININE 0.84 01/28/2017 1015   CALCIUM 9.5 07/11/2020 1112   PROT 7.5 04/03/2018 0849   ALBUMIN 4.2 04/03/2018 0849   AST 24 04/03/2018 0849   ALT 23 04/03/2018 0849   ALKPHOS 79 04/03/2018 0849   BILITOT 0.9 04/03/2018 0849   GFRNONAA >60 07/11/2020 1112   GFRAA >60 07/11/2020 1112     Diabetic Labs (most recent): Lab Results  Component Value Date   HGBA1C 5.8 (H) 01/28/2017     Lipid Panel ( most recent) Lipid Panel     Component Value Date/Time   CHOL 281 (H) 04/03/2018 0849   TRIG 393 (H) 04/03/2018 0849   HDL 46 04/03/2018 0849   CHOLHDL 6.1 04/03/2018 0849   VLDL  79 (H) 04/03/2018 0849   LDLCALC 156 (H) 04/03/2018 0849       Lab Results  Component Value Date   TSH 44.82 (A) 05/26/2020   TSH 4.941 (H) 04/03/2018   TSH 3.350 10/24/2017   TSH 13.41 (H) 01/28/2017       ASSESSMENT / PLAN:  1. Hypothyroidism Patient with long-standing hypothyroidism, on levothyroxine therapy. On physical exam, patient does not  have gross goiter, thyroid nodules, or neck compression symptoms.  - We discussed about correct intake of levothyroxine, at fasting, with water, separated by at least 30 minutes from  breakfast, and separated by more than 4 hours from calcium, iron, multivitamins, acid reflux medications (PPIs).  -Patient is made aware of the fact that thyroid hormone replacement is needed for life, dose to be adjusted by periodic monitoring of thyroid function tests. - Will check thyroid tests before next visit: TSH, free T4.  Will also check TPO antibodies and thyroglobulin antibodies to help uncover the etiology of her hypothyroidism.  -Will obtain baseline thyroid US.   - Time spent with the patient: 45 minutes, of which >50% was spent in obtaining information about her symptoms, reviewing her previous labs, evaluations, and treatments, counseling her about her  hypothyroidism , and developing a plan to confirm the diagnosis and long term treatment as necessary. Please refer to " Patient Self Inventory" in the Media  tab for reviewed elements of pertinent patient history.  Caryl Asp participated in the discussions, expressed understanding, and voiced agreement with the above plans.  All questions were answered to her satisfaction. she is encouraged to contact clinic should she have any questions or concerns prior to her return visit.   FOLLOW UP PLAN: Return in about 4 weeks (around 09/05/2020) for Thyroid follow up, Previsit labs, thyroid ultrasound.  Rayetta Pigg, [facility] [address] [place], [place] [address] Phone: [phone] Fax: [phone]  08/08/2020, 3:56 PM  This note was partially dictated with voice recognition software. Similar sounding words can be transcribed inadequately or may not  be corrected upon review.

## 2020-08-14 ENCOUNTER — Telehealth: Payer: Self-pay

## 2020-08-14 NOTE — Telephone Encounter (Signed)
Left voicemail for pt to return call to advise of Thyroid U/S

## 2020-08-17 ENCOUNTER — Ambulatory Visit (HOSPITAL_COMMUNITY): Payer: Medicaid Other

## 2020-08-23 ENCOUNTER — Telehealth: Payer: Self-pay

## 2020-08-23 ENCOUNTER — Ambulatory Visit (HOSPITAL_COMMUNITY)
Admission: RE | Admit: 2020-08-23 | Discharge: 2020-08-23 | Disposition: A | Discharge status: Home / Self Care | Payer: Medicaid Other | Source: Ambulatory Visit | Attending: Nurse Practitioner | Admitting: Nurse Practitioner

## 2020-08-23 ENCOUNTER — Other Ambulatory Visit: Payer: Self-pay

## 2020-08-23 DIAGNOSIS — E039 Hypothyroidism, unspecified: Secondary | ICD-10-CM | POA: Insufficient documentation

## 2020-08-23 NOTE — Telephone Encounter (Signed)
Pt called to have medical records faxed to have her SSI claim complete.   SSI case ID - [id number] Fax # - [phone]  RN placed inbasket message to HIM department.  Pt aware.

## 2020-08-31 LAB — TSH: TSH: 0.16 — AB (ref 0.41–5.90)

## 2020-09-04 LAB — SURGICAL PATHOLOGY

## 2020-09-05 ENCOUNTER — Encounter: Payer: Self-pay | Admitting: Nurse Practitioner

## 2020-09-05 ENCOUNTER — Telehealth (INDEPENDENT_AMBULATORY_CARE_PROVIDER_SITE_OTHER): Payer: Medicaid Other | Admitting: Nurse Practitioner

## 2020-09-05 DIAGNOSIS — E063 Autoimmune thyroiditis: Secondary | ICD-10-CM | POA: Diagnosis not present

## 2020-09-05 DIAGNOSIS — E038 Other specified hypothyroidism: Secondary | ICD-10-CM | POA: Diagnosis not present

## 2020-09-05 MED ORDER — TIROSINT 88 MCG PO CAPS: 88.0000 ug | ORAL_CAPSULE | Freq: Every day | ORAL | 3 refills | Status: DC

## 2020-09-05 NOTE — Progress Notes (Signed)
Endocrinology Follow Up Visit                                        09/05/2020, 2:32 PM    TELEHEALTH VISIT: The patient is being engaged in telehealth visit due to COVID-19.  This type of visit limits physical examination significantly, and thus is not preferable over face-to-face encounters.  I connected with  Emily Clark on 09/05/20 by a video enabled telemedicine application and verified that I am speaking with the correct person using two identifiers.   I discussed the limitations of evaluation and management by telemedicine. The patient expressed understanding and agreed to proceed.    The participants involved in this visit include: Brita Romp, NP located at [facility] and Emily Clark  located at their personal residence listed.   SUBJECTIVE:  Emily Clark is a 59 y.o.-year-old female patient being seen in consultation for hypothyroidism referred by Leonie Douglas, MD.   Past Medical History:  Diagnosis Date  . Anxiety   . Cancer ([place])   . Complication of anesthesia   . Family history of brain cancer   . Family history of breast cancer   . Family history of ovarian cancer   . Family history of prostate cancer   . GERD (gastroesophageal reflux disease)   . Headache   . Hyperlipidemia   . Hypertension   . Hypothyroidism   . Left breast mass   . PONV (postoperative nausea and vomiting)    x 1 surgery in [place]  . Thyroid disease     Past Surgical History:  Procedure Laterality Date  . BREAST LUMPECTOMY WITH RADIOACTIVE SEED AND SENTINEL LYMPH NODE BIOPSY Right 07/14/2020   Procedure: RIGHT BREAST LUMPECTOMY WITH RADIOACTIVE SEED AND SENTINEL LYMPH NODE BIOPSY;  Surgeon: Jovita Kussmaul, MD;  Location: [facility];  Service: General;  Laterality: Right;  . BREAST LUMPECTOMY WITH RADIOACTIVE SEED LOCALIZATION Left 06/09/2019   Procedure: LEFT BREAST  LUMPECTOMY WITH RADIOACTIVE SEED LOCALIZATION;  Surgeon: Jovita Kussmaul, MD;  Location: [facility];  Service: General;  Laterality: Left;  . BREAST SURGERY    . CHOLECYSTECTOMY    . finger reattachment Right    third  . HEMORRHOID SURGERY    . TUBAL LIGATION    . tummy tuck      Social History   Socioeconomic History  . Marital status: Single    Spouse name: Not on file  . Number of children: 2  . Years of education: Not on file  . Highest education level: Some college, no degree  Occupational History  . Not on file  Tobacco Use  . Smoking status: Former Smoker    Packs/day: 0.50    Years: 41.00    Pack years: 20.50    Quit date: 09/06/2015    Years since quitting: 5.0  . Smokeless tobacco: Never Used  Vaping Use  . Vaping Use: Never used  Substance and Sexual Activity  . Alcohol use: Yes    Comment: rare social  . Drug use: No  . Sexual activity: Not on file    Comment: BTL  Other Topics Concern  . Not on file  Social History Narrative  . Not on file   Social Determinants of Health   Financial Resource Strain:   . Difficulty of Paying Living Expenses: Not on file  Food Insecurity:   . Worried About Charity fundraiser in the Last Year: Not on file  . Ran Out of Food in the Last Year: Not on file  Transportation Needs:   . Lack of Transportation (Medical): Not on file  . Lack of Transportation (Non-Medical): Not on file  Physical Activity:   . Days of Exercise per Week: Not on file  . Minutes of Exercise per Session: Not on file  Stress:   . Feeling of Stress : Not on file  Social Connections:   . Frequency of Communication with Friends and Family: Not on file  . Frequency of Social Gatherings with Friends and Family: Not on file  . Attends Religious Services: Not on file  . Active Member of Clubs or Organizations: Not on file  . Attends Archivist Meetings: Not on file  . Marital Status: Not on file    Family History  Problem  Relation Age of Onset  . Stroke Mother 19  . Diabetes Mother   . Heart disease Father   . Coronary artery disease Father   . Brain cancer Daughter 41       glioblastoma  . Skin cancer Son   . Prostate cancer Maternal Uncle   . Breast cancer Paternal Aunt 85  . Ovarian cancer Paternal Aunt 58  . Prostate cancer Maternal Grandfather   . Stroke Paternal Grandmother   . Diabetes Paternal Grandfather   . Prostate cancer Maternal Uncle     Outpatient Encounter Medications as of 09/05/2020  Medication Sig  . atenolol (TENORMIN) 50 MG tablet Take 50 mg by mouth daily.  Marland Kitchen FLUoxetine (PROZAC) 20 MG capsule Take 20 mg by mouth daily.  Marland Kitchen letrozole (FEMARA) 2.5 MG tablet Take 2.5 mg by mouth daily.  Marland Kitchen levothyroxine (SYNTHROID, LEVOTHROID) 100 MCG tablet Take 1 tablet (100 mcg total) by mouth daily. (Patient taking differently: Take 100 mcg by mouth daily before breakfast. )  . Multiple Vitamin (MULTIVITAMIN) tablet Take 1 tablet by mouth 2 (two) times a week.   . spironolactone (ALDACTONE) 50 MG tablet Take 50 mg by mouth daily.  Marland Kitchen tretinoin (RETIN-A) 0.05 % cream Apply 1 application topically daily as needed (facial eczema).   . [DISCONTINUED] FLUoxetine (PROZAC) 10 MG capsule Take 20 mg by mouth daily.    No facility-administered encounter medications on file as of 09/05/2020.    ALLERGIES: Allergies  Allergen Reactions  . Penicillins Palpitations  . Codeine Itching  . Amoxicillin Palpitations  . Tetracyclines & Related Rash   VACCINATION STATUS:  There is no immunization history on file for this patient.   HPI    Emily Clark  is a patient with the above medical history. she was diagnosed with hyperthyroidism at approximate age of 59 years old, which required no antithyroid treatment, in fact she reports she was started on thyroid replacement hormone.   she was given various doses of Levothyroxine over the years, currently on 100 micrograms of the generic form Euthyrox.  She  was once told by a doctor that she  did not have any problem with her thyroid and was taken off of her hormone supplementation.  She reports she felt fine during that time when she wasn't taking any supplements, but when she moved to the area, her PCP told her she had hypothyroidism and restarted her on medication.  She reports that she has " felt bad" ever since. she reports compliance to this medication: but commonly will take this medication with her other medications instead of on an empty stomach.  She reports she had a thyroid ultrasound in 2015 when she lived in [place], [place].  Report not available for review.  I reviewed patient's thyroid tests:  Lab Results  Component Value Date   TSH 0.16 (A) 08/31/2020   TSH 44.82 (A) 05/26/2020   TSH 4.941 (H) 04/03/2018   TSH 3.350 10/24/2017   TSH 13.41 (H) 01/28/2017     Pt describes: - weight gain - fatigue - dry skin - hair loss  Pt denies feeling nodules in neck, hoarseness, dysphagia/odynophagia, SOB with lying down.  she does have a family history of thyroid disorders in her mother and maternal grandmother (both hypothyroidism).  No family history of thyroid cancer. No history of radiation therapy to head or neck. No recent use of iodine supplements.  She denies use of Biotin containing supplements.  I reviewed her chart and she also has a history of anxiety, GERD, HLD, HTN, frequent headaches, and breast cancer (S/p lumpectomy bilaterally).   ROS:  Constitutional: + weight gain, + fatigue, no subjective hyperthermia, no subjective hypothermia Eyes: no blurry vision, no xerophthalmia ENT: no sore throat, no nodules palpated in throat, no dysphagia/odynophagia, no hoarseness Cardiovascular: no Chest Pain, no Shortness of Breath, no palpitations, no leg swelling Respiratory: no cough, no SOB Gastrointestinal: no Nausea/Vomiting/Diarrhea Musculoskeletal: no muscle/joint aches Skin: no rashes Neurological: no tremors, no  numbness, no tingling, no dizziness Psychiatric: no depression, no anxiety   OBJECTIVE:  LMP  (LMP Unknown)  Wt Readings from Last 3 Encounters:  08/08/20 155 lb 12.8 oz (70.7 kg)  07/14/20 157 lb (71.2 kg)  07/11/20 157 lb 3.2 oz (71.3 kg)   BP Readings from Last 3 Encounters:  08/08/20 120/70  07/14/20 138/69  07/11/20 130/76     Physical Exam- Telehealth- significantly limited due to nature of visit  Constitutional: There is no height or weight on file to calculate BMI. , not in acute distress, normal state of mind Respiratory: Adequate breathing efforts   CMP ( most recent) CMP     Component Value Date/Time   NA 137 07/11/2020 1112   K 4.4 07/11/2020 1112   CL 102 07/11/2020 1112   CO2 26 07/11/2020 1112   GLUCOSE 105 (H) 07/11/2020 1112   BUN 11 07/11/2020 1112   CREATININE 0.85 07/11/2020 1112   CREATININE 0.84 01/28/2017 1015   CALCIUM 9.5 07/11/2020 1112   PROT 7.5 04/03/2018 0849   ALBUMIN 4.2 04/03/2018 0849   AST 24 04/03/2018 0849   ALT 23 04/03/2018 0849   ALKPHOS 79 04/03/2018 0849   BILITOT 0.9 04/03/2018 0849   GFRNONAA >60 07/11/2020 1112   GFRAA >60 07/11/2020 1112     Diabetic Labs (most recent): Lab Results  Component Value Date   HGBA1C 5.8 (H) 01/28/2017     Lipid Panel ( most recent) Lipid Panel     Component Value Date/Time   CHOL 281 (H) 04/03/2018 0849   TRIG 393 (H) 04/03/2018 0849   HDL 46 04/03/2018 0849   CHOLHDL 6.1  04/03/2018 0849   VLDL 79 (H) 04/03/2018 0849   LDLCALC 156 (H) 04/03/2018 0849       Lab Results  Component Value Date   TSH 0.16 (A) 08/31/2020   TSH 44.82 (A) 05/26/2020   TSH 4.941 (H) 04/03/2018   TSH 3.350 10/24/2017   TSH 13.41 (H) 01/28/2017    Thyroid US 08/23/2020 CLINICAL DATA:  Hypothyroidism  EXAM: THYROID ULTRASOUND  TECHNIQUE: Ultrasound examination of the thyroid gland and adjacent soft tissues was performed.  COMPARISON:  None.  FINDINGS: Parenchymal Echotexture:  Markedly heterogenous  Isthmus: 3 mm  Right lobe: 3.5 x 1.3 x 1.1 cm  Left lobe: 3.0 x 1.3 x 0.8 cm  _________________________________________________________  Estimated total number of nodules >/= 1 cm: 0  Number of spongiform nodules >/=  2 cm not described below (TR1): 0  Number of mixed cystic and solid nodules >/= 1.5 cm not described below (TR2): 0  _________________________________________________________  Markedly heterogeneous atrophic thyroid without discrete nodule or focal abnormality. No hypervascularity or regional adenopathy.  IMPRESSION: Heterogeneous atrophic thyroid compatible with sequelae from chronic medical thyroid disease or previous thyroiditis.  No other significant finding by ultrasound.  The above is in keeping with the ACR TI-RADS recommendations - J Am Coll Radiol 2017;14:587-595. ------------------------------------------------------------------------------------------------------------------------------- ASSESSMENT / PLAN:  1. Hypothyroidism - Hashimoto's Thyroiditis Her previsit thyroid function tests are consistent with the suspected Hashimoto's thyroiditis and also indicate slight over replacement. She is still not tolerating levothyroxine well and would like to try an alternative.  Will start her on Tirosint 88 mcg po daily before breakfast.  - We discussed about correct intake of thyroid hormone, at fasting, with water, separated by at least 30 minutes from breakfast, and separated by more than 4 hours from calcium, iron, multivitamins, acid reflux medications (PPIs).  -Patient is made aware of the fact that thyroid hormone replacement is needed for life, dose to be adjusted by periodic monitoring of thyroid function tests.  Baseline thyroid ultrasound was WNL- no need for further follow up.   I spent 20 minutes dedicated to the care of this patient on the date of this encounter to include pre-visit review of records,  face-to-face time with the patient, and post visit ordering of  testing.  Emily Clark participated in the discussions, expressed understanding, and voiced agreement with the above plans.  All questions were answered to her satisfaction. she is encouraged to contact clinic should she have any questions or concerns prior to her return visit.   FOLLOW UP PLAN: No follow-ups on file.  Rayetta Pigg, [facility] [address] [place], [place] [address] Phone: [phone] Fax: [phone]  09/05/2020, 2:32 PM  This note was partially dictated with voice recognition software. Similar sounding words can be transcribed inadequately or may not  be corrected upon review.

## 2020-09-05 NOTE — Patient Instructions (Signed)

## 2020-09-14 ENCOUNTER — Ambulatory Visit: Payer: Self-pay

## 2020-09-29 ENCOUNTER — Telehealth: Payer: Self-pay | Admitting: *Deleted

## 2020-09-29 NOTE — Telephone Encounter (Signed)
The HIM dept will be happy to send her records to [facility]. The Disability office does not call us, they send [place] a written request & authorization to release records. We have no requests in our system for her records.
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
## 2020-10-02 NOTE — Telephone Encounter (Signed)
CSW spoke with patient about local and national resources. Patient has already accessed local [facility] resources. She was not aware of [employer], [employer], or other breast cancer foundations. CSW shared information and told patient to request supporting documents (as needed) from [facility] as that is where she is receiving her active treatment.   Garnet Koyanagi, LCSW

## 2020-10-04 ENCOUNTER — Telehealth: Payer: Self-pay | Admitting: *Deleted
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
## 2020-10-04 NOTE — Telephone Encounter (Signed)
-----   Message from Pearson Grippe sent at 10/02/2020  4:25 PM EST ----- Regarding: FMLA/ DISABILITY Hi Ros,   I forwarded you a messaged that a patient had left for valerie regarding FMLA. Patient is hoping to talk with someone as soon as possible. I spoke to her this afternoon and she wanted me to pass on this number as this is where the documents she indicated she requested by [facility]. [phone]. I'm not sure if you have spoken to her but wanted to pass on the information I leave town this evening. Thank you and Happy Thanksgiving.   Benjie Karvonen

## 2020-10-04 NOTE — Telephone Encounter (Signed)
Message left for LINCOLN KLEINER ([phone]) to return call to this nurse to discuss needs.  Connected with collaborative nurse regarding FMLA form documents being faxed.  Neither has received request for whatever is needed for this patient.  Emily Clark seen once 03/21/2020 for high risk breast screening.

## 2020-11-09 ENCOUNTER — Ambulatory Visit (INDEPENDENT_AMBULATORY_CARE_PROVIDER_SITE_OTHER): Payer: Self-pay | Admitting: *Deleted

## 2020-11-09 ENCOUNTER — Other Ambulatory Visit: Payer: Self-pay

## 2020-11-09 VITALS — Ht 61.5 in | Wt 158.6 lb

## 2020-11-09 DIAGNOSIS — Z86010 Personal history of colonic polyps: Secondary | ICD-10-CM

## 2020-11-09 MED ORDER — NA SULFATE-K SULFATE-MG SULF 17.5-3.13-1.6 GM/177ML PO SOLN: 1.0000 | Freq: Once | ORAL | 0 refills | Status: AC

## 2020-11-09 NOTE — Patient Instructions (Addendum)
Emily Clark  Dec 04, 1960 MRN: [id number]    Remember Covid test 12/08/2020 at 3:20 pm.   Procedure Date:  12/11/2020 Time to register:  10:30 am Place to register: [facility] Procedure Time: 12:00 pm Scheduled provider: Dr. Abbey Chatters    PREPARATION FOR COLONOSCOPY WITH SUPREP BOWEL PREP KIT  Note: Suprep Bowel Prep Kit is a split-dose (2day) regimen. Consumption of BOTH 6-ounce bottles is required for a complete prep.  Please notify us immediately if you are diabetic, take iron supplements, or if you are on Coumadin or any other blood thinners.  Please hold the following medications: n/a                                                                                                                                                  2 DAYS BEFORE PROCEDURE:  DATE: 12/09/2020   DAY: Saturday Begin clear liquid diet AFTER your lunch meal. NO SOLID FOODS after this point.  1 DAY BEFORE PROCEDURE:  DATE: 12/10/2020   DAY: Sunday Continue clear liquids the entire day - NO SOLID FOOD.   Diabetic medications adjustments for today: n/a  At 6:00pm: Complete steps 1 through 4 below, using ONE (1) 6-ounce bottle, before going to bed. Step 1:  Pour ONE (1) 6-ounce bottle of SUPREP liquid into the mixing container.  Step 2:  Add cool drinking water to the 16 ounce line on the container and mix.  Note: Dilute the solution concentrate as directed prior to use. Step 3:  DRINK ALL the liquid in the container. Step 4:  You MUST drink an additional two (2) or more 16 ounce containers of water over the next one (1) hour.   Continue clear liquids.  DAY OF PROCEDURE:   DATE: 12/11/2020  DAY: Monday If you take medications for your heart, blood pressure, or breathing, you may take these medications.  Diabetic medications adjustments for today: n/a  5 hours before your procedure at :  7:00 am Step 1:  Pour ONE (1) 6-ounce bottle of SUPREP liquid into the mixing container.  Step 2:  Add cool  drinking water to the 16 ounce line on the container and mix.  Note: Dilute the solution concentrate as directed prior to use. Step 3:  DRINK ALL the liquid in the container. Step 4:  You MUST drink an additional two (2) or more 16 ounce containers of water over the next one (1) hour. You MUST complete the final glass of water at least 3 hours before your colonoscopy. Nothing by mouth past 9:00 am.  You may take your morning medications with sip of water unless we have instructed otherwise.    Please see below for Dietary Information.  CLEAR LIQUIDS INCLUDE:  Water Jello (NOT red in color)   Ice Popsicles (NOT red in color)   Tea (sugar ok,  no milk/cream) Powdered fruit flavored drinks  Coffee (sugar ok, no milk/cream) Gatorade/ Lemonade/ Kool-Aid  (NOT red in color)   Juice: apple, white grape, white cranberry Soft drinks  Clear bullion, consomme, broth (fat free beef/chicken/vegetable)  Carbonated beverages (any kind)  Strained chicken noodle soup Hard Candy   Remember: Clear liquids are liquids that will allow you to see your fingers on the other side of a clear glass. Be sure liquids are NOT red in color, and not cloudy, but CLEAR.  DO NOT EAT OR DRINK ANY OF THE FOLLOWING:  Dairy products of any kind   Cranberry juice Tomato juice / V8 juice   Grapefruit juice Orange juice     Red grape juice  Do not eat any solid foods, including such foods as: cereal, oatmeal, yogurt, fruits, vegetables, creamed soups, eggs, bread, crackers, pureed foods in a blender, etc.   HELPFUL HINTS FOR DRINKING PREP SOLUTION:   Make sure prep is extremely cold. Mix and refrigerate the the morning of the prep. You may also put in the freezer.   You may try mixing some Crystal Light or Country Time Lemonade if you prefer. Mix in small amounts; add more if necessary.  Try drinking through a straw  Rinse mouth with water or a mouthwash between glasses, to remove after-taste.  Try sipping on a cold  beverage /ice/ popsicles between glasses of prep.  Place a piece of sugar-free hard candy in mouth between glasses.  If you become nauseated, try consuming smaller amounts, or stretch out the time between glasses. Stop for 30-60 minutes, then slowly start back drinking.     OTHER INSTRUCTIONS  You will need a responsible adult at least 60 years of age to accompany you and drive you home. This person must remain in the waiting room during your procedure. The hospital will cancel your procedure if you do not have a responsible adult with you.   1. Wear loose fitting clothing that is easily removed. 2. Leave jewelry and other valuables at home.  3. Remove all body piercing jewelry and leave at home. 4. Total time from sign-in until discharge is approximately 2-3 hours. 5. You should go home directly after your procedure and rest. You can resume normal activities the day after your procedure. 6. The day of your procedure you should not:  Drive  Make legal decisions  Operate machinery  Drink alcohol  Return to work   You may call the office (Dept: [phone]) before 5:00pm, or page the doctor on call [phone]) after 5:00pm, for further instructions, if necessary.   Insurance Information YOU WILL NEED TO CHECK WITH YOUR INSURANCE COMPANY FOR THE BENEFITS OF COVERAGE YOU HAVE FOR THIS PROCEDURE.  UNFORTUNATELY, NOT ALL INSURANCE COMPANIES HAVE BENEFITS TO COVER ALL OR PART OF THESE TYPES OF PROCEDURES.  IT IS YOUR RESPONSIBILITY TO CHECK YOUR BENEFITS, HOWEVER, WE WILL BE GLAD TO ASSIST YOU WITH ANY CODES YOUR INSURANCE COMPANY MAY NEED.    PLEASE NOTE THAT MOST INSURANCE COMPANIES WILL NOT COVER A SCREENING COLONOSCOPY FOR PEOPLE UNDER THE AGE OF 60  IF YOU HAVE BCBS INSURANCE, YOU MAY HAVE BENEFITS FOR A SCREENING COLONOSCOPY BUT IF POLYPS ARE FOUND THE DIAGNOSIS WILL CHANGE AND THEN YOU MAY HAVE A DEDUCTIBLE THAT WILL NEED TO BE MET. SO PLEASE MAKE SURE YOU CHECK YOUR  BENEFITS FOR A SCREENING COLONOSCOPY AS WELL AS A DIAGNOSTIC COLONOSCOPY.

## 2020-11-09 NOTE — Progress Notes (Addendum)
Gastroenterology Pre-Procedure Review  Request Date: 11/09/2020 Requesting Physician: Dr. Waldon Reining, Last TCS 01/10/2015 in [place], [place] done by Dr. Eveline Keto, sessile serrated adenoma/polyps, hyperplastic polyp  PATIENT REVIEW QUESTIONS: The patient responded to the following health history questions as indicated:    1. Diabetes Melitis: no 2. Joint replacements in the past 12 months: no 3. Major health problems in the past 3 months: yes, breast cancer, f/u appt 11/21/2020 [facility]  4. Has an artificial valve or MVP: no 5. Has a defibrillator: no 6. Has been advised in past to take antibiotics in advance of a procedure like teeth cleaning: no 7. Family history of colon cancer: no 8. Alcohol Use: no 9. Illicit drug Use: no 10. History of sleep apnea: no 11. History of coronary artery or other vascular stents placed within the last 12 months: no 12. History of any prior anesthesia complications: yes, n/v 13. Body mass index is 29.48 kg/m.    MEDICATIONS & ALLERGIES:    Patient reports the following regarding taking any blood thinners:   Plavix? no Aspirin? no Coumadin? no Brilinta? no Xarelto? no Eliquis? no Pradaxa? no Savaysa? no Effient? no  Patient confirms/reports the following medications:  Current Outpatient Medications  Medication Sig Dispense Refill  . atenolol (TENORMIN) 50 MG tablet Take 50 mg by mouth daily.    Marland Kitchen FLUoxetine (PROZAC) 20 MG capsule Take 20 mg by mouth daily.    Marland Kitchen letrozole (FEMARA) 2.5 MG tablet Take 2.5 mg by mouth daily.    . Multiple Vitamin (MULTIVITAMIN) tablet Take 1 tablet by mouth every 30 (thirty) days.    Marland Kitchen omeprazole (PRILOSEC) 20 MG capsule Take 20 mg by mouth daily.    Marland Kitchen spironolactone (ALDACTONE) 50 MG tablet Take 50 mg by mouth daily.    Marland Kitchen TIROSINT 88 MCG CAPS Take 1 capsule (88 mcg total) by mouth daily before breakfast. 30 capsule 3  . tretinoin (RETIN-A) 0.05 % cream Apply 1 application topically daily as  needed (facial eczema).      No current facility-administered medications for this visit.    Patient confirms/reports the following allergies:  Allergies  Allergen Reactions  . Penicillins Palpitations  . Codeine Itching  . Amoxicillin Palpitations  . Tetracyclines & Related Rash    No orders of the defined types were placed in this encounter.   AUTHORIZATION INFORMATION Primary Insurance: [employer] MCD,  ID #: [id number] S,  Group #: [facility] Pre-Cert / Auth required: Yes, approved online for 02/17/8118-11/15/7827 Pre-Cert / Auth #: [id number]  SCHEDULE INFORMATION: Procedure has been scheduled as follows:  Date: 12/11/2020, Time: 12:00 Location: [facility] with Dr. Marletta Lor  This Gastroenterology Pre-Precedure Review Form is being routed to the following provider(s): Tana Coast, PA

## 2020-11-10 NOTE — Progress Notes (Signed)
Ok to schedule.  ASA II 

## 2020-11-13 ENCOUNTER — Other Ambulatory Visit: Payer: Self-pay | Admitting: *Deleted

## 2020-12-08 ENCOUNTER — Other Ambulatory Visit: Payer: Self-pay

## 2020-12-08 ENCOUNTER — Other Ambulatory Visit (HOSPITAL_COMMUNITY)
Admission: RE | Admit: 2020-12-08 | Discharge: 2020-12-08 | Disposition: A | Discharge status: Home / Self Care | Payer: Medicaid Other | Source: Ambulatory Visit | Attending: Internal Medicine | Admitting: Internal Medicine

## 2020-12-08 DIAGNOSIS — Z20822 Contact with and (suspected) exposure to covid-19: Secondary | ICD-10-CM | POA: Insufficient documentation

## 2020-12-08 DIAGNOSIS — Z01812 Encounter for preprocedural laboratory examination: Secondary | ICD-10-CM | POA: Insufficient documentation

## 2020-12-08 LAB — BASIC METABOLIC PANEL
Anion gap: 10 (ref 5–15)
BUN: 16 mg/dL (ref 6–20)
CO2: 25 mmol/L (ref 22–32)
Calcium: 9.7 mg/dL (ref 8.9–10.3)
Chloride: 99 mmol/L (ref 98–111)
Creatinine, Ser: 0.82 mg/dL (ref 0.44–1.00)
GFR, Estimated: >60 mL/min (ref >60)
Glucose, Bld: 115 mg/dL — ABNORMAL HIGH (ref 70–99)
Potassium: 3.9 mmol/L (ref 3.5–5.1)
Sodium: 134 mmol/L — ABNORMAL LOW (ref 135–145)

## 2020-12-09 LAB — SARS CORONAVIRUS 2 (TAT 6-24 HRS): SARS Coronavirus 2: NEGATIVE

## 2020-12-11 ENCOUNTER — Encounter (HOSPITAL_COMMUNITY)
Admission: RE | Disposition: A | Discharge status: Home / Self Care | Payer: Self-pay | Source: Home / Self Care | Attending: Internal Medicine

## 2020-12-11 ENCOUNTER — Encounter (HOSPITAL_COMMUNITY): Payer: Self-pay | Admitting: *Deleted

## 2020-12-11 ENCOUNTER — Other Ambulatory Visit: Payer: Self-pay

## 2020-12-11 ENCOUNTER — Ambulatory Visit (HOSPITAL_COMMUNITY): Payer: Medicaid Other | Admitting: Anesthesiology

## 2020-12-11 ENCOUNTER — Ambulatory Visit (HOSPITAL_COMMUNITY)
Admission: RE | Admit: 2020-12-11 | Discharge: 2020-12-11 | Disposition: A | Discharge status: Home / Self Care | Payer: Medicaid Other | Attending: Internal Medicine | Admitting: Internal Medicine

## 2020-12-11 ENCOUNTER — Telehealth: Payer: Self-pay | Admitting: Internal Medicine

## 2020-12-11 DIAGNOSIS — Z86010 Personal history of colonic polyps: Secondary | ICD-10-CM | POA: Insufficient documentation

## 2020-12-11 DIAGNOSIS — Z881 Allergy status to other antibiotic agents status: Secondary | ICD-10-CM | POA: Diagnosis not present

## 2020-12-11 DIAGNOSIS — D123 Benign neoplasm of transverse colon: Secondary | ICD-10-CM | POA: Insufficient documentation

## 2020-12-11 DIAGNOSIS — Z885 Allergy status to narcotic agent status: Secondary | ICD-10-CM | POA: Diagnosis not present

## 2020-12-11 DIAGNOSIS — Z79811 Long term (current) use of aromatase inhibitors: Secondary | ICD-10-CM | POA: Insufficient documentation

## 2020-12-11 DIAGNOSIS — K648 Other hemorrhoids: Secondary | ICD-10-CM | POA: Diagnosis not present

## 2020-12-11 DIAGNOSIS — K5730 Diverticulosis of large intestine without perforation or abscess without bleeding: Secondary | ICD-10-CM | POA: Diagnosis not present

## 2020-12-11 DIAGNOSIS — Z87891 Personal history of nicotine dependence: Secondary | ICD-10-CM | POA: Diagnosis not present

## 2020-12-11 DIAGNOSIS — Z79890 Hormone replacement therapy (postmenopausal): Secondary | ICD-10-CM | POA: Diagnosis not present

## 2020-12-11 DIAGNOSIS — K635 Polyp of colon: Secondary | ICD-10-CM

## 2020-12-11 DIAGNOSIS — Z79899 Other long term (current) drug therapy: Secondary | ICD-10-CM | POA: Insufficient documentation

## 2020-12-11 DIAGNOSIS — Z1211 Encounter for screening for malignant neoplasm of colon: Secondary | ICD-10-CM | POA: Insufficient documentation

## 2020-12-11 DIAGNOSIS — Z880 Allergy status to penicillin: Secondary | ICD-10-CM | POA: Diagnosis not present

## 2020-12-11 HISTORY — DX: Personal history of malignant neoplasm of breast: Z85.3

## 2020-12-11 HISTORY — PX: COLONOSCOPY WITH PROPOFOL: SHX5780

## 2020-12-11 HISTORY — PX: POLYPECTOMY: SHX5525

## 2020-12-11 HISTORY — PX: BIOPSY: SHX5522

## 2020-12-11 SURGERY — COLONOSCOPY WITH PROPOFOL
Anesthesia: General

## 2020-12-11 MED ORDER — PROPOFOL 500 MG/50ML IV EMUL
INTRAVENOUS | Status: DC | PRN
  Administered 2020-12-11: 150 ug/kg/min via INTRAVENOUS

## 2020-12-11 MED ORDER — LACTATED RINGERS IV SOLN: INTRAVENOUS | Status: DC

## 2020-12-11 MED ORDER — PROPOFOL 10 MG/ML IV BOLUS
INTRAVENOUS | Status: DC | PRN
  Administered 2020-12-11: 50 mg via INTRAVENOUS
  Administered 2020-12-11: 20 mg via INTRAVENOUS

## 2020-12-11 MED ORDER — LACTATED RINGERS IV SOLN: INTRAVENOUS | Status: DC | PRN

## 2020-12-11 MED ORDER — LIDOCAINE HCL (CARDIAC) PF 100 MG/5ML IV SOSY
PREFILLED_SYRINGE | INTRAVENOUS | Status: DC | PRN
  Administered 2020-12-11: 50 mg via INTRATRACHEAL

## 2020-12-11 MED ORDER — KETAMINE HCL 50 MG/5ML IJ SOSY
PREFILLED_SYRINGE | INTRAMUSCULAR | Status: AC
  Filled 2020-12-11: qty 5

## 2020-12-11 MED ORDER — CHLORHEXIDINE GLUCONATE CLOTH 2 % EX PADS: 6.0000 | MEDICATED_PAD | Freq: Once | CUTANEOUS | Status: DC

## 2020-12-11 MED ORDER — STERILE WATER FOR IRRIGATION IR SOLN
Status: DC | PRN
  Administered 2020-12-11: 100 mL

## 2020-12-11 MED ORDER — KETAMINE HCL 10 MG/ML IJ SOLN
INTRAMUSCULAR | Status: DC | PRN
  Administered 2020-12-11: 15 mg via INTRAVENOUS

## 2020-12-11 NOTE — Discharge Instructions (Signed)
  Colonoscopy Discharge Instructions  Read the instructions outlined below and refer to this sheet in the next few weeks. These discharge instructions provide you with general information on caring for yourself after you leave the hospital. Your doctor may also give you specific instructions. While your treatment has been planned according to the most current medical practices available, unavoidable complications occasionally occur.   ACTIVITY  You may resume your regular activity, but move at a slower pace for the next 24 hours.   Take frequent rest periods for the next 24 hours.   Walking will help get rid of the air and reduce the bloated feeling in your belly (abdomen).   No driving for 24 hours (because of the medicine (anesthesia) used during the test).    Do not sign any important legal documents or operate any machinery for 24 hours (because of the anesthesia used during the test).  NUTRITION  Drink plenty of fluids.   You may resume your normal diet as instructed by your doctor.   Begin with a light meal and progress to your normal diet. Heavy or fried foods are harder to digest and may make you feel sick to your stomach (nauseated).   Avoid alcoholic beverages for 24 hours or as instructed.  MEDICATIONS  You may resume your normal medications unless your doctor tells you otherwise.  WHAT YOU CAN EXPECT TODAY  Some feelings of bloating in the abdomen.   Passage of more gas than usual.   Spotting of blood in your stool or on the toilet paper.  IF YOU HAD POLYPS REMOVED DURING THE COLONOSCOPY:  No aspirin products for 7 days or as instructed.   No alcohol for 7 days or as instructed.   Eat a soft diet for the next 24 hours.  FINDING OUT THE RESULTS OF YOUR TEST Not all test results are available during your visit. If your test results are not back during the visit, make an appointment with your caregiver to find out the results. Do not assume everything is normal if  you have not heard from your caregiver or the medical facility. It is important for you to follow up on all of your test results.  SEEK IMMEDIATE MEDICAL ATTENTION IF:  You have more than a spotting of blood in your stool.   Your belly is swollen (abdominal distention).   You are nauseated or vomiting.   You have a temperature over 101.   You have abdominal pain or discomfort that is severe or gets worse throughout the day.   Your colonoscopy was relatively unremarkable.  I did find a large scar and tattoo from previous polypectomy.  The tissue appeared healthy without residual polyp tissue.  I did take biopsies of the site.  You had 1 small polyp which I removed successfully.  I am going to request your colonoscopy records from [place] but I feel like the polyp was removed successfully.  Recommend repeating colonoscopy in 5 years.  Follow-up with GI as needed.  I hope you have a great rest of your week!  Elon Alas. Abbey Chatters, D.O. Gastroenterology and Hepatology [facility]

## 2020-12-11 NOTE — Transfer of Care (Signed)
Immediate Anesthesia Transfer of Care Note  Patient: Emily Clark  Procedure(s) Performed: COLONOSCOPY WITH PROPOFOL (N/A ) BIOPSY POLYPECTOMY  Patient Location: Endoscopy Unit  Anesthesia Type:General  Level of Consciousness: awake  Airway & Oxygen Therapy: Patient Spontanous Breathing  Post-op Assessment: Report given to RN  Post vital signs: Reviewed  Last Vitals:  Vitals Value Taken Time  BP    Temp    Pulse    Resp    SpO2      Last Pain:  Vitals:   12/11/20 1138  TempSrc:   PainSc: 0-No pain      Patients Stated Pain Goal: 8 (19/75/88 3254)  Complications: No complications documented.

## 2020-12-11 NOTE — Telephone Encounter (Signed)
Mailing a release of information to her house so she can sign and return

## 2020-12-11 NOTE — Anesthesia Preprocedure Evaluation (Addendum)
Anesthesia Evaluation  Patient identified by MRN, date of birth, ID band Patient awake    Reviewed: Allergy & Precautions, NPO status , Patient's Chart, lab work & pertinent test results  History of Anesthesia Complications (+) PONV and history of anesthetic complications  Airway Mallampati: II  TM Distance: >3 FB Neck ROM: Full    Dental  (+) Dental Advisory Given, Missing   Pulmonary former smoker,    Pulmonary exam normal breath sounds clear to auscultation       Cardiovascular Exercise Tolerance: Good hypertension, Pt. on medications Normal cardiovascular exam Rhythm:Regular Rate:Normal     Neuro/Psych  Headaches, Anxiety    GI/Hepatic GERD  Medicated and Controlled,(+)     (-) substance abuse  ,   Endo/Other  Hypothyroidism   Renal/GU negative Renal ROS     Musculoskeletal negative musculoskeletal ROS (+)   Abdominal   Peds  Hematology negative hematology ROS (+)   Anesthesia Other Findings   Reproductive/Obstetrics negative OB ROS                            Anesthesia Physical Anesthesia Plan  ASA: II  Anesthesia Plan: General   Post-op Pain Management:    Induction: Intravenous  PONV Risk Score and Plan:   Airway Management Planned: Nasal Cannula and Natural Airway  Additional Equipment:   Intra-op Plan:   Post-operative Plan:   Informed Consent: I have reviewed the patients History and Physical, chart, labs and discussed the procedure including the risks, benefits and alternatives for the proposed anesthesia with the patient or authorized representative who has indicated his/her understanding and acceptance.     Dental advisory given  Plan Discussed with: CRNA and Surgeon  Anesthesia Plan Comments:         Anesthesia Quick Evaluation

## 2020-12-11 NOTE — H&P (Signed)
Primary Care Physician:  Leonie Douglas, MD Primary Gastroenterologist:  Dr. Abbey Chatters  Pre-Procedure History & Physical: HPI:  Emily Clark is a 60 y.o. female is here for a colonoscopy to be performed for surveillance purposes due to personal history of sessile serrated adenomas. Last colonoscopy 01/10/2015 in [place], [place] done by Dr. Demetria Pore, sessile serrated adenoma/polyps, hyperplastic polyp.  Patient denies any family history of colorectal cancer.  No melena or hematochezia.  No abdominal pain or unintentional weight loss.  No change in bowel habits.  Overall feels well from a GI standpoint.  Past Medical History:  Diagnosis Date  . Anxiety   . Cancer ([place])   . Complication of anesthesia   . Family history of brain cancer   . Family history of breast cancer   . Family history of ovarian cancer   . Family history of prostate cancer   . GERD (gastroesophageal reflux disease)   . Headache   . History of right breast cancer   . Hyperlipidemia   . Hypertension   . Hypothyroidism   . Left breast mass   . PONV (postoperative nausea and vomiting)    x 1 surgery in [place]  . Thyroid disease     Past Surgical History:  Procedure Laterality Date  . BREAST LUMPECTOMY WITH RADIOACTIVE SEED AND SENTINEL LYMPH NODE BIOPSY Right 07/14/2020   Procedure: RIGHT BREAST LUMPECTOMY WITH RADIOACTIVE SEED AND SENTINEL LYMPH NODE BIOPSY;  Surgeon: Jovita Kussmaul, MD;  Location: [facility];  Service: General;  Laterality: Right;  . BREAST LUMPECTOMY WITH RADIOACTIVE SEED LOCALIZATION Left 06/09/2019   Procedure: LEFT BREAST LUMPECTOMY WITH RADIOACTIVE SEED LOCALIZATION;  Surgeon: Jovita Kussmaul, MD;  Location: [facility];  Service: General;  Laterality: Left;  . BREAST SURGERY    . CHOLECYSTECTOMY    . finger reattachment Right    third  . HEMORRHOID SURGERY    . TUBAL LIGATION    . tummy tuck      Prior to Admission medications   Medication Sig Start Date End Date Taking?  Authorizing Provider  atenolol (TENORMIN) 50 MG tablet Take 50 mg by mouth daily. 06/14/20  Yes [provider]  COLLAGEN PO Take 15 mLs by mouth daily. Liquid collagen complex with amino acid protein and biotin   Yes [provider]  FLUoxetine (PROZAC) 20 MG capsule Take 20 mg by mouth daily. 08/31/20  Yes [provider]  letrozole (FEMARA) 2.5 MG tablet Take 2.5 mg by mouth daily. 08/30/20  Yes [provider]  Multiple Vitamin (MULTIVITAMIN) tablet Take 1 tablet by mouth daily. prenatel   Yes [provider]  omeprazole (PRILOSEC) 20 MG capsule Take 20 mg by mouth daily.   Yes [provider]  spironolactone (ALDACTONE) 50 MG tablet Take 50 mg by mouth daily. 07/18/20  Yes [provider]  TIROSINT 88 MCG CAPS Take 1 capsule (88 mcg total) by mouth daily before breakfast. 09/05/20  Yes Reardon, Loree Fee J, NP  tretinoin (RETIN-A) 0.05 % cream Apply 1 application topically once a week. 03/06/20  Yes [provider]    Allergies as of 11/13/2020 - Review Complete 11/09/2020  Allergen Reaction Noted  . Penicillins Palpitations 01/22/2017  . Codeine Itching 03/27/2017  . Amoxicillin Palpitations 01/22/2017  . Tetracyclines & related Rash 10/23/2017    Family History  Problem Relation Age of Onset  . Stroke Mother 11  . Diabetes Mother   . Heart disease Father   . Coronary artery  disease Father   . Brain cancer Daughter 70       glioblastoma  . Skin cancer Son   . Prostate cancer Maternal Uncle   . Breast cancer Paternal Aunt 85  . Ovarian cancer Paternal Aunt 36  . Prostate cancer Maternal Grandfather   . Stroke Paternal Grandmother   . Diabetes Paternal Grandfather   . Prostate cancer Maternal Uncle     Social History   Socioeconomic History  . Marital status: Single    Spouse name: Not on file  . Number of children: 2  . Years of education: Not on file  . Highest education level: Some college, no degree   Occupational History  . Not on file  Tobacco Use  . Smoking status: Former Smoker    Packs/day: 0.50    Years: 41.00    Pack years: 20.50    Quit date: 09/06/2015    Years since quitting: 5.2  . Smokeless tobacco: Never Used  Vaping Use  . Vaping Use: Never used  Substance and Sexual Activity  . Alcohol use: Yes    Comment: rare social  . Drug use: No  . Sexual activity: Not on file    Comment: BTL  Other Topics Concern  . Not on file  Social History Narrative  . Not on file   Social Determinants of Health   Financial Resource Strain: Not on file  Food Insecurity: Not on file  Transportation Needs: Not on file  Physical Activity: Not on file  Stress: Not on file  Social Connections: Not on file  Intimate Partner Violence: Not on file    Review of Systems: See HPI, otherwise negative ROS  Impression/Plan: Emily Clark is here for a colonoscopy to be performed for surveillance purposes due to personal history of sessile serrated adenomas.   The risks of the procedure including infection, bleed, or perforation as well as benefits, limitations, alternatives and imponderables have been reviewed with the patient. Questions have been answered. All parties agreeable.

## 2020-12-11 NOTE — Telephone Encounter (Signed)
Can we please request patient's last TCS report 01/10/2015 in [place], [place] done by Dr. Demetria Pore. Pathology report as well would great. Thank you

## 2020-12-11 NOTE — Anesthesia Postprocedure Evaluation (Signed)
Anesthesia Post Note  Patient: Emily Clark  Procedure(s) Performed: COLONOSCOPY WITH PROPOFOL (N/A ) BIOPSY POLYPECTOMY  Patient location during evaluation: Endoscopy Anesthesia Type: General Level of consciousness: awake and alert Pain management: pain level controlled Vital Signs Assessment: post-procedure vital signs reviewed and stable Respiratory status: spontaneous breathing Cardiovascular status: blood pressure returned to baseline and stable Postop Assessment: no apparent nausea or vomiting Anesthetic complications: no   No complications documented.   Last Vitals:  Vitals:   12/11/20 1035  BP: 129/75  Pulse: 82  Resp: 15  Temp: 36.5 C  SpO2: 98%    Last Pain:  Vitals:   12/11/20 1138  TempSrc:   PainSc: 0-No pain                 [name]

## 2020-12-11 NOTE — Op Note (Signed)
[facility] Patient Name: Emily Clark Procedure Date: 12/11/2020 11:27 AM MRN: [id number] Date of Birth: 09-24-1961 Attending MD: Elon Alas. Abbey Chatters DO CSN: [id number] Age: 60 Admit Type: Outpatient Procedure:                Colonoscopy Indications:              High risk colon cancer surveillance: Personal                            history of colonic polyps Providers:                Elon Alas. Abbey Chatters, DO, Jessica Boudreaux, Randa Spike, Technician Referring MD:              Medicines:                See the Anesthesia note for documentation of the                            administered medications Complications:            No immediate complications. Estimated Blood Loss:     Estimated blood loss was minimal. Procedure:                Pre-Anesthesia Assessment:                           - The anesthesia plan was to use monitored                            anesthesia care (MAC).                           After obtaining informed consent, the colonoscope                            was passed under direct vision. Throughout the                            procedure, the patient's blood pressure, pulse, and                            oxygen saturations were monitored continuously. The                            [id number]-HQ190L ([id number]) scope was introduced through                            the anus and advanced to the the cecum, identified                            by appendiceal orifice and ileocecal valve. The                            colonoscopy was performed without difficulty. The  patient tolerated the procedure well. The quality                            of the bowel preparation was evaluated using the                            BBPS [facility] Bowel Preparation Scale) with scores                            of: Right Colon = 3, Transverse Colon = 3 and Left                            Colon = 3 (entire mucosa seen  well with no residual                            staining, small fragments of stool or opaque                            liquid). The total BBPS score equals 9. Scope In: 11:43:28 AM Scope Out: 11:57:01 AM Scope Withdrawal Time: 0 hours 10 minutes 15 seconds  Total Procedure Duration: 0 hours 13 minutes 33 seconds  Findings:      The perianal and digital rectal examinations were normal.      Non-bleeding internal hemorrhoids were found during endoscopy.      Scattered small and large-mouthed diverticula were found in the entire       colon.      A 2 mm polyp was found in the transverse colon. The polyp was sessile.       The polyp was removed with a cold biopsy forceps. Resection and       retrieval were complete.      A 15 mm post polypectomy scar with previous tatoo was found at the       hepatic flexure. There was no evidence of the previous polyp. Biopsies       were taken with a cold forceps for histology. Impression:               - Non-bleeding internal hemorrhoids.                           - Diverticulosis in the entire examined colon.                           - One 2 mm polyp in the transverse colon, removed                            with a cold biopsy forceps. Resected and retrieved.                           - Post-polypectomy scar at the hepatic flexure.                            Biopsied. Moderate Sedation:      Per Anesthesia Care Recommendation:           - Patient has a contact  number available for                            emergencies. The signs and symptoms of potential                            delayed complications were discussed with the                            patient. Return to normal activities tomorrow.                            Written discharge instructions were provided to the                            patient.                           - Resume previous diet.                           - Continue present medications.                           -  Await pathology results.                           - Repeat colonoscopy in 5 years for surveillance.                           - Return to GI clinic PRN. Procedure Code(s):        --- Professional ---                           [phone], Colonoscopy, flexible; with biopsy, single                            or multiple Diagnosis Code(s):        --- Professional ---                           Z86.010, Personal history of colonic polyps                           K64.8, Other hemorrhoids                           K63.5, Polyp of colon                           Z98.890, Other specified postprocedural states                           K57.30, Diverticulosis of large intestine without                            perforation or abscess without bleeding CPT copyright 2019 American Medical Association. All  rights reserved. The codes documented in this report are preliminary and upon coder review may  be revised to meet current compliance requirements. Elon Alas. Abbey Chatters, DO [place] Abbey Chatters, DO 12/11/2020 12:01:40 PM This report has been signed electronically. Number of Addenda: 0

## 2020-12-12 LAB — SURGICAL PATHOLOGY

## 2020-12-13 ENCOUNTER — Encounter (HOSPITAL_COMMUNITY): Payer: Self-pay | Admitting: Internal Medicine

## 2021-01-09 ENCOUNTER — Ambulatory Visit: Payer: Medicaid Other | Admitting: Nurse Practitioner

## 2021-01-12 ENCOUNTER — Other Ambulatory Visit: Payer: Self-pay | Admitting: Nurse Practitioner

## 2021-01-26 LAB — TSH: TSH: 0.79 (ref 0.41–5.90)

## 2021-02-08 ENCOUNTER — Ambulatory Visit (INDEPENDENT_AMBULATORY_CARE_PROVIDER_SITE_OTHER): Payer: Medicaid Other | Admitting: Nurse Practitioner

## 2021-02-08 ENCOUNTER — Other Ambulatory Visit: Payer: Self-pay

## 2021-02-08 ENCOUNTER — Encounter: Payer: Self-pay | Admitting: Nurse Practitioner

## 2021-02-08 VITALS — BP 132/83 | HR 82 | Ht 61.0 in | Wt 158.0 lb

## 2021-02-08 DIAGNOSIS — E038 Other specified hypothyroidism: Secondary | ICD-10-CM

## 2021-02-08 DIAGNOSIS — E063 Autoimmune thyroiditis: Secondary | ICD-10-CM

## 2021-02-08 NOTE — Progress Notes (Signed)
Endocrinology Follow Up Visit                                        02/08/2021, 2:40 PM   SUBJECTIVE:  Emily Clark is a 60 y.o.-year-old female patient being seen in follow up after being seen in consultation for hypothyroidism referred by Leonie Douglas, MD.   Past Medical History:  Diagnosis Date  . Anxiety   . Cancer ([place])   . Complication of anesthesia   . Family history of brain cancer   . Family history of breast cancer   . Family history of ovarian cancer   . Family history of prostate cancer   . GERD (gastroesophageal reflux disease)   . Headache   . History of right breast cancer   . Hyperlipidemia   . Hypertension   . Hypothyroidism   . Left breast mass   . PONV (postoperative nausea and vomiting)    x 1 surgery in [place]  . Thyroid disease     Past Surgical History:  Procedure Laterality Date  . BIOPSY  12/11/2020   Procedure: BIOPSY;  Surgeon: Eloise Harman, DO;  [facility];  Service: Endoscopy;;  . BREAST LUMPECTOMY WITH RADIOACTIVE SEED AND SENTINEL LYMPH NODE BIOPSY Right 07/14/2020   Procedure: RIGHT BREAST LUMPECTOMY WITH RADIOACTIVE SEED AND SENTINEL LYMPH NODE BIOPSY;  Surgeon: Jovita Kussmaul, MD;  Location: [place];  Service: General;  Laterality: Right;  . BREAST LUMPECTOMY WITH RADIOACTIVE SEED LOCALIZATION Left 06/09/2019   Procedure: LEFT BREAST LUMPECTOMY WITH RADIOACTIVE SEED LOCALIZATION;  Surgeon: Jovita Kussmaul, MD;  Location: [place];  Service: General;  Laterality: Left;  . BREAST SURGERY    . CHOLECYSTECTOMY    . COLONOSCOPY WITH PROPOFOL N/A 12/11/2020   Procedure: COLONOSCOPY WITH PROPOFOL;  Surgeon: Eloise Harman, DO;  [facility];  Service: Endoscopy;  Laterality: N/A;  12:00/ ASA II  . finger reattachment Right    third  . HEMORRHOID SURGERY    . POLYPECTOMY  12/11/2020   Procedure: POLYPECTOMY;  Surgeon:  Eloise Harman, DO;  [facility];  Service: Endoscopy;;  . TUBAL LIGATION    . tummy tuck      Social History   Socioeconomic History  . Marital status: Single    Spouse name: Not on file  . Number of children: 2  . Years of education: Not on file  . Highest education level: Some college, no degree  Occupational History  . Not on file  Tobacco Use  . Smoking status: Former Smoker    Packs/day: 0.50    Years: 41.00    Pack years: 20.50    Quit date: 09/06/2015    Years since quitting: 5.4  . Smokeless tobacco: Never Used  Vaping Use  . Vaping Use: Never used  Substance and Sexual Activity  . Alcohol use: Yes    Comment: rare social  .  Drug use: No  . Sexual activity: Not on file    Comment: BTL  Other Topics Concern  . Not on file  Social History Narrative  . Not on file   Social Determinants of Health   Financial Resource Strain: Not on file  Food Insecurity: Not on file  Transportation Needs: Not on file  Physical Activity: Not on file  Stress: Not on file  Social Connections: Not on file    Family History  Problem Relation Age of Onset  . Stroke Mother 69  . Diabetes Mother   . Heart disease Father   . Coronary artery disease Father   . Brain cancer Daughter 33       glioblastoma  . Skin cancer Son   . Prostate cancer Maternal Uncle   . Breast cancer Paternal Aunt 85  . Ovarian cancer Paternal Aunt 61  . Prostate cancer Maternal Grandfather   . Stroke Paternal Grandmother   . Diabetes Paternal Grandfather   . Prostate cancer Maternal Uncle     Outpatient Encounter Medications as of 02/08/2021  Medication Sig  . atenolol (TENORMIN) 50 MG tablet Take 50 mg by mouth daily.  . COLLAGEN PO Take 15 mLs by mouth daily. Liquid collagen complex with amino acid protein and biotin  . FLUoxetine (PROZAC) 20 MG capsule Take 5 mg by mouth daily.  Marland Kitchen letrozole (FEMARA) 2.5 MG tablet Take 2.5 mg by mouth daily.  . Multiple Vitamin (MULTIVITAMIN)  tablet Take 1 tablet by mouth daily. prenatel  . omeprazole (PRILOSEC) 20 MG capsule Take 20 mg by mouth daily.  Marland Kitchen spironolactone (ALDACTONE) 50 MG tablet Take 50 mg by mouth daily.  Marland Kitchen TIROSINT 88 MCG CAPS TAKE 1 CAPSULE BY MOUTH ONCE DAILY BEFORE BREAKFAST  . tretinoin (RETIN-A) 0.05 % cream Apply 1 application topically once a week.   No facility-administered encounter medications on file as of 02/08/2021.    ALLERGIES: Allergies  Allergen Reactions  . Penicillins Palpitations    Reaction: 2 years ago/ Childhood  . Codeine Itching  . Amoxicillin Palpitations  . Tetracyclines & Related Rash   VACCINATION STATUS: Immunization History  Administered Date(s) Administered  . Moderna Sars-Covid-2 Vaccination 01/05/2020, 02/02/2020     HPI    Emily Clark  is a patient with the above medical history. she was diagnosed with hyperthyroidism at approximate age of 60 years old, which required no antithyroid treatment, in fact she reports she was started on thyroid replacement hormone.   she was given various doses of Levothyroxine over the years, currently on 100 micrograms of the generic form Euthyrox.  She was once told by a doctor that she did not have any problem with her thyroid and was taken off of her hormone supplementation.  She reports she felt fine during that time when she wasn't taking any supplements, but when she moved to the area, her PCP told her she had hypothyroidism and restarted her on medication.  She reports that she has " felt bad" ever since. she reports compliance to this medication: but commonly will take this medication with her other medications instead of on an empty stomach.  She reports she had a thyroid ultrasound in 2015 when she lived in [place], [place].  Report not available for review.  I reviewed patient's thyroid tests:  Lab Results  Component Value Date   TSH 0.79 01/26/2021   TSH 0.16 (A) 08/30/2020   TSH 44.82 (A) 05/26/2020   TSH 4.941 (H)  04/03/2018  TSH 3.350 10/24/2017   TSH 13.41 (H) 01/28/2017     Pt describes: - weight gain - fatigue - dry skin - hair loss  Pt denies feeling nodules in neck, hoarseness, dysphagia/odynophagia, SOB with lying down.  she does have a family history of thyroid disorders in her mother and maternal grandmother (both hypothyroidism).  No family history of thyroid cancer. No history of radiation therapy to head or neck. No recent use of iodine supplements.  She denies use of Biotin containing supplements.  I reviewed her chart and she also has a history of anxiety, GERD, HLD, HTN, frequent headaches, and breast cancer (S/p lumpectomy bilaterally).   ROS:  Constitutional: steady weight, + fatigue (improving), no subjective hyperthermia, no subjective hypothermia Eyes: no blurry vision, no xerophthalmia ENT: no sore throat, no nodules palpated in throat, no dysphagia/odynophagia, no hoarseness Cardiovascular: no Chest Pain, no Shortness of Breath, no palpitations, no leg swelling Respiratory: no cough, no SOB Gastrointestinal: no Nausea/Vomiting/Diarrhea Musculoskeletal: no muscle/joint aches Skin: no rashes Neurological: no tremors, no numbness, no tingling, no dizziness Psychiatric: no depression, no anxiety   OBJECTIVE:  BP 132/83   Pulse 82   Ht 5\' 1"  (1.549 m)   Wt 158 lb (71.7 kg)   LMP  (LMP Unknown)   BMI 29.85 kg/m  Wt Readings from Last 3 Encounters:  02/08/21 158 lb (71.7 kg)  12/11/20 158 lb (71.7 kg)  11/09/20 158 lb 9.6 oz (71.9 kg)   BP Readings from Last 3 Encounters:  02/08/21 132/83  12/11/20 91/65  08/08/20 120/70    Physical Exam- Limited  Constitutional:  Body mass index is 29.85 kg/m. , not in acute distress, normal state of mind Eyes:  EOMI, no exophthalmos Neck: Supple Cardiovascular: RRR, no murmers, rubs, or gallops, no edema Respiratory: Adequate breathing efforts, no crackles, rales, rhonchi, or wheezing Musculoskeletal: no gross  deformities, strength intact in all four extremities, no gross restriction of joint movements Skin:  no rashes, no hyperemia Neurological: no tremor with outstretched hands   CMP ( most recent) CMP     Component Value Date/Time   NA 134 (L) 12/08/2020 1552   K 3.9 12/08/2020 1552   CL 99 12/08/2020 1552   CO2 25 12/08/2020 1552   GLUCOSE 115 (H) 12/08/2020 1552   BUN 16 12/08/2020 1552   CREATININE 0.82 12/08/2020 1552   CREATININE 0.84 01/28/2017 1015   CALCIUM 9.7 12/08/2020 1552   PROT 7.5 04/03/2018 0849   ALBUMIN 4.2 04/03/2018 0849   AST 24 04/03/2018 0849   ALT 23 04/03/2018 0849   ALKPHOS 79 04/03/2018 0849   BILITOT 0.9 04/03/2018 0849   GFRNONAA >60 12/08/2020 1552   GFRAA >60 07/11/2020 1112     Diabetic Labs (most recent): Lab Results  Component Value Date   HGBA1C 5.8 (H) 01/28/2017     Lipid Panel ( most recent) Lipid Panel     Component Value Date/Time   CHOL 281 (H) 04/03/2018 0849   TRIG 393 (H) 04/03/2018 0849   HDL 46 04/03/2018 0849   CHOLHDL 6.1 04/03/2018 0849   VLDL 79 (H) 04/03/2018 0849   LDLCALC 156 (H) 04/03/2018 0849       Lab Results  Component Value Date   TSH 0.79 01/26/2021   TSH 0.16 (A) 08/30/2020   TSH 44.82 (A) 05/26/2020   TSH 4.941 (H) 04/03/2018   TSH 3.350 10/24/2017   TSH 13.41 (H) 01/28/2017    Thyroid US 08/23/2020 CLINICAL DATA:  Hypothyroidism  EXAM:  THYROID ULTRASOUND  TECHNIQUE: Ultrasound examination of the thyroid gland and adjacent soft tissues was performed.  COMPARISON:  None.  FINDINGS: Parenchymal Echotexture: Markedly heterogenous  Isthmus: 3 mm  Right lobe: 3.5 x 1.3 x 1.1 cm  Left lobe: 3.0 x 1.3 x 0.8 cm  _________________________________________________________  Estimated total number of nodules >/= 1 cm: 0  Number of spongiform nodules >/=  2 cm not described below (TR1): 0  Number of mixed cystic and solid nodules >/= 1.5 cm not described below (TR2):  0  _________________________________________________________  Markedly heterogeneous atrophic thyroid without discrete nodule or focal abnormality. No hypervascularity or regional adenopathy.  IMPRESSION: Heterogeneous atrophic thyroid compatible with sequelae from chronic medical thyroid disease or previous thyroiditis.  No other significant finding by ultrasound.  The above is in keeping with the ACR TI-RADS recommendations - J Am Coll Radiol 2017;14:587-595. ------------------------------------------------------------------------------------------------------------------------------- ASSESSMENT / PLAN:  1. Hypothyroidism - Hashimoto's Thyroiditis -Her previsit thyroid function tests are consistent with appropriate hormone replacement.  She is advised to continue Tirosint 88 mcg po daily before breakfast.  She has tolerated this formulation of thyroid hormone much better than the others.  - We discussed about correct intake of thyroid hormone, at fasting, with water, separated by at least 30 minutes from breakfast, and separated by more than 4 hours from calcium, iron, multivitamins, acid reflux medications (PPIs).  -Patient is made aware of the fact that thyroid hormone replacement is needed for life, dose to be adjusted by periodic monitoring of thyroid function tests.      - Time spent on this patient care encounter:  20 minutes of which 50% was spent in  counseling and the rest reviewing  her current and  previous labs / studies and medications  doses and developing a plan for long term care, and documenting this care. Caryl Asp  participated in the discussions, expressed understanding, and voiced agreement with the above plans.  All questions were answered to her satisfaction. she is encouraged to contact clinic should she have any questions or concerns prior to her return visit.   FOLLOW UP PLAN: Return in about 6 months (around 08/10/2021) for Thyroid follow up,  Previsit labs.  Rayetta Pigg, [facility] [address] [place], [place] [address] Phone: [phone] Fax: [phone]  02/08/2021, 2:40 PM

## 2021-02-08 NOTE — Patient Instructions (Signed)
-   The correct intake of thyroid hormone (Levothyroxine, Synthroid, Tirosint), is on empty stomach first thing in the morning, with water, separated by at least 30 minutes from breakfast and other medications, and separated by more than 4 hours from calcium, iron, multivitamins, acid reflux medications (PPIs).  - This medication is a life-long medication and will be needed to correct thyroid hormone imbalances for the rest of your life.  The dose may change from time to time, based on thyroid blood work.  - It is extremely important to be consistent taking this medication, near the same time each morning.  -AVOID TAKING PRODUCTS CONTAINING BIOTIN (commonly found in Hair, Skin, Nails vitamins) AS IT INTERFERES WITH THE VALIDITY OF THYROID FUNCTION BLOOD TESTS.

## 2021-04-01 IMAGING — US US  BREAST BX W/ LOC DEV 1ST LESION IMG BX SPEC US GUIDE*R*
1 series · 11 of 11 positions shown · non-contrast
Comparison: Previous exam(s).
COMPARISON: Previous exam(s).

Addendum:
CLINICAL DATA: Patient presents for ultrasound-guided core needle
biopsy of a 6 mm mass in the right breast at 7 o'clock, 3 cm from
the nipple. Patient has a recent history a left breast excision for
atypical ductal hyperplasia.

EXAM:
ULTRASOUND GUIDED RIGHT BREAST CORE NEEDLE BIOPSY

[Series 1: us breast bx w/ loc dev 1st lesion img bx spec us  · 0.07mm/px · 11 of 11 slices shown]
[im 1/11]
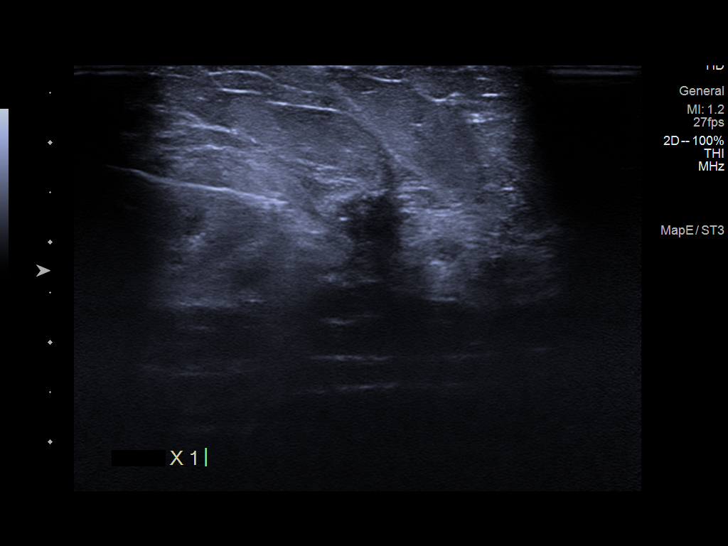
[im 2/11]
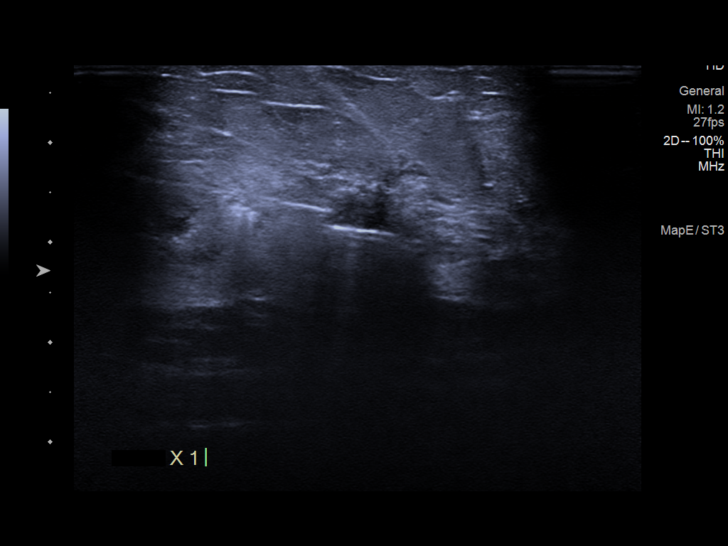
[im 3/11]
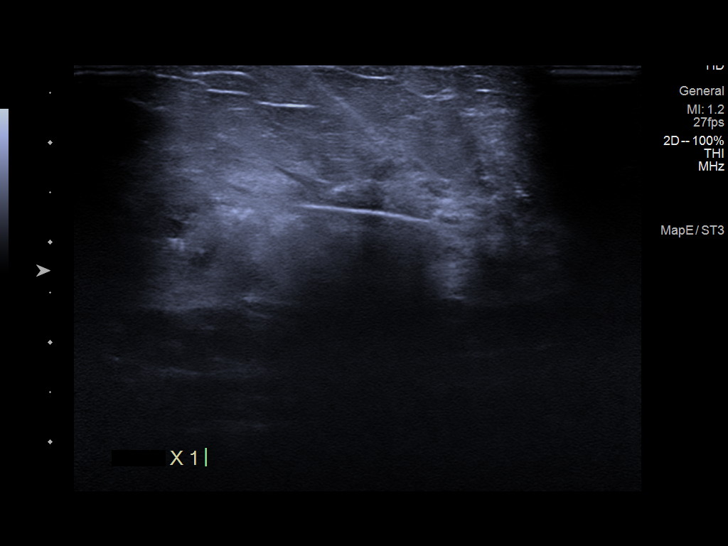
[im 4/11]
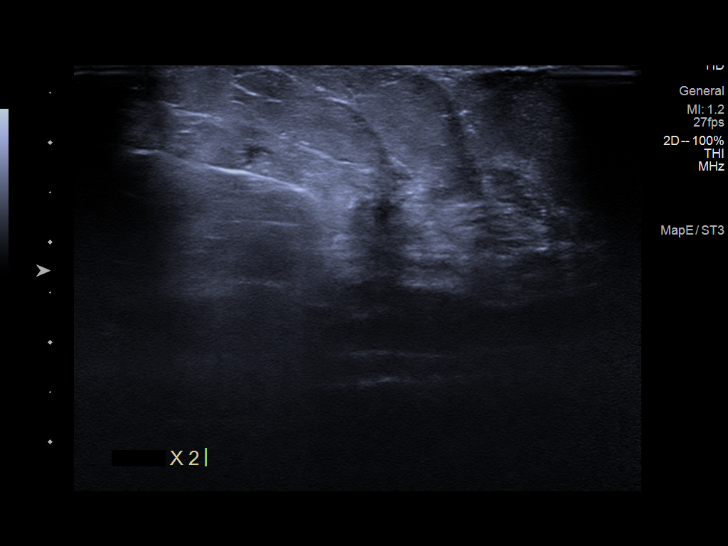
[im 5/11]
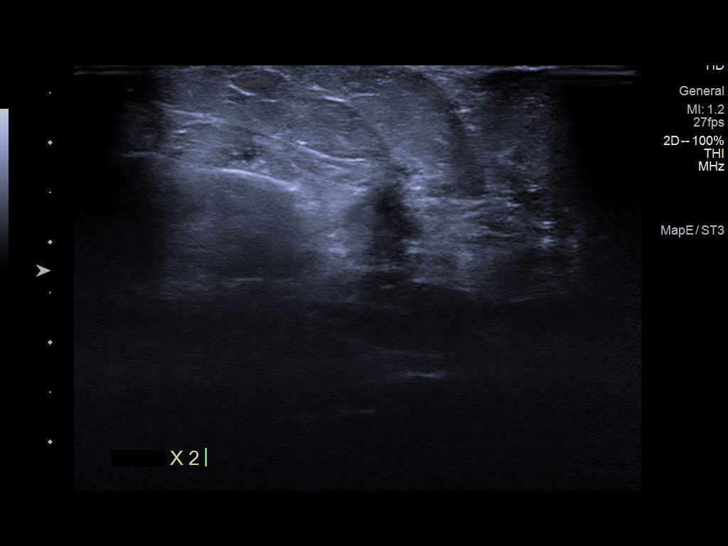
[im 6/11]
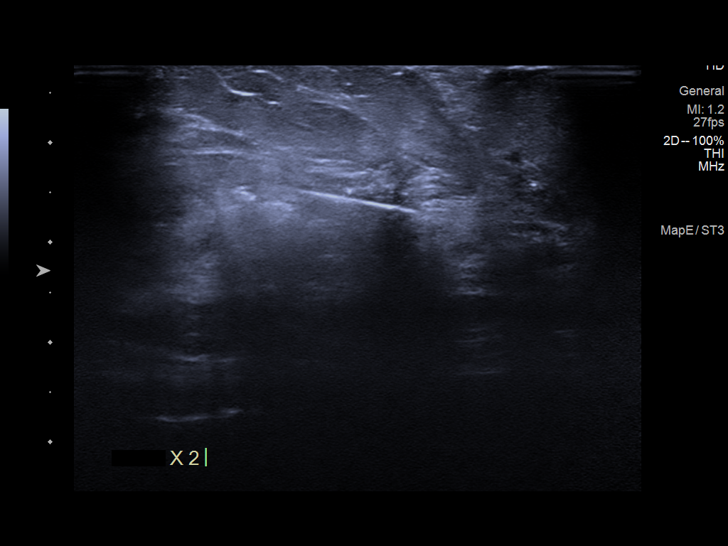
[im 7/11]
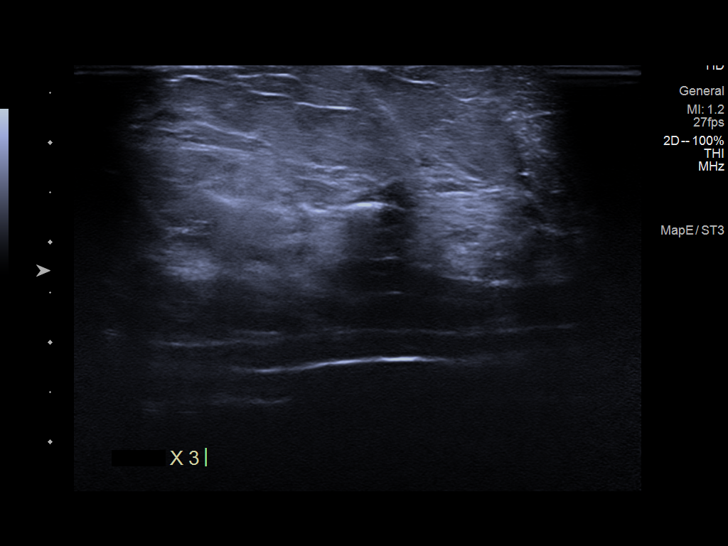
[im 8/11]
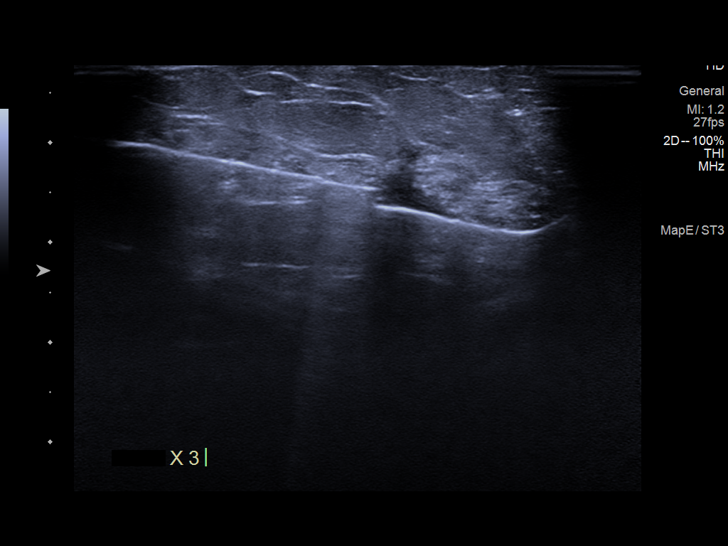
[im 9/11]
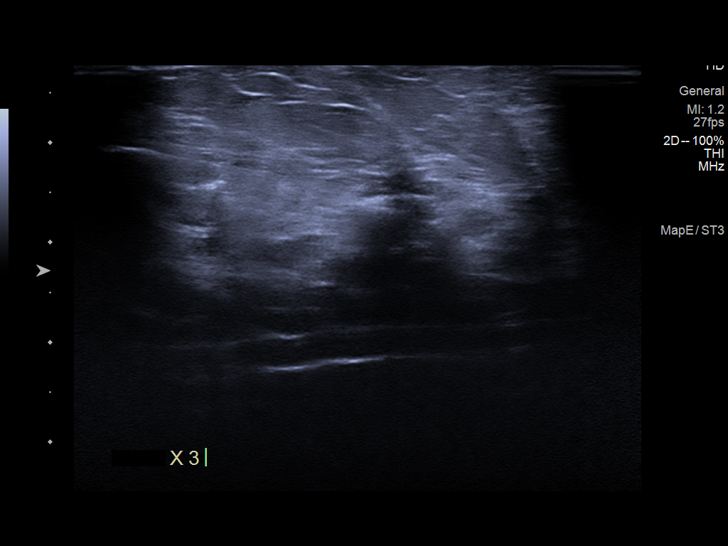
[im 10/11]
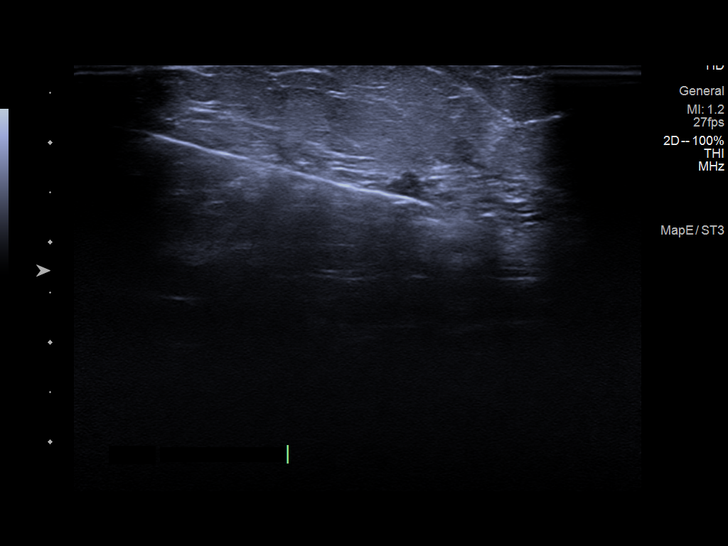
[im 11/11]
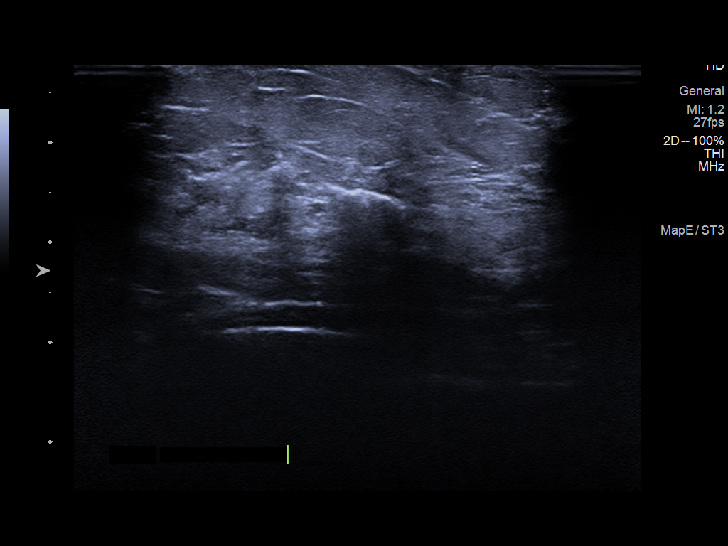

[11 of 11 positions shown; findings below may reference images not displayed]



Lesion quadrant: Lower outer quadrant

Using sterile technique and 1% Lidocaine as local anesthetic, under
direct ultrasound visualization, a 12 gauge Tiger device was
used to perform biopsy of the 6 mm mass at 7 o'clock, 3 cm from the
nipple, using an inferior approach. At the conclusion of the
procedure ribbon shaped tissue marker clip was deployed into the
biopsy cavity. Follow up 2 view mammogram was performed and dictated
separately.
IMPRESSION: Ultrasound guided biopsy of a small right breast mass. No apparent
complications.

ADDENDUM:
PATHOLOGY revealed: A. BREAST, [DATE], 3CM, RIGHT, BIOPSY: - Invasive
ductal carcinoma, see comment.

COMMENT: The carcinoma appears grade 2 and measures 7 mm in greatest
linear extent. Prognostic makers will be ordered. Dr. Donnamar has
reviewed the case.

Pathology results are CONCORDANT with imaging findings, per Dr.
Melle Elibol.

Pathology results and recommendations below were discussed with
patient by telephone on 05/10/2020. Patient reported biopsy site
within normal limits with slight tenderness at the site. Post biopsy
care instructions were reviewed and questions were answered. Patient
was instructed to call [HOSPITAL] [HOSPITAL] Mammography
Department if any concerns or questions arise related to the biopsy.

Recommendation: Surgical referral. I arranged surgical consultation
for patient to see Dr. Allattin Sarak at [REDACTED] on

Addendum by Arnaldo Tipton RN on 05/11/2020.



Lesion quadrant: Lower outer quadrant

Using sterile technique and 1% Lidocaine as local anesthetic, under
direct ultrasound visualization, a 12 gauge Tiger device was
used to perform biopsy of the 6 mm mass at 7 o'clock, 3 cm from the
nipple, using an inferior approach. At the conclusion of the
procedure ribbon shaped tissue marker clip was deployed into the
biopsy cavity. Follow up 2 view mammogram was performed and dictated
separately.
IMPRESSION: Ultrasound guided biopsy of a small right breast mass. No apparent
complications.

## 2021-04-11 ENCOUNTER — Other Ambulatory Visit: Payer: Self-pay | Admitting: Nurse Practitioner

## 2021-08-13 ENCOUNTER — Ambulatory Visit: Payer: Medicaid Other | Admitting: Nurse Practitioner

## 2021-08-13 ENCOUNTER — Other Ambulatory Visit: Payer: Self-pay | Admitting: Nurse Practitioner

## 2021-08-15 ENCOUNTER — Telehealth: Payer: Self-pay

## 2021-08-15 NOTE — Telephone Encounter (Signed)
Patient states that Emily Clark is not giving her the Tirosint, something about insurance. I am guessing a PA. Have you seen anything for this? Please advise with patient

## 2021-08-15 NOTE — Telephone Encounter (Signed)
I called the pharmacy and the lady stated that her insurance card has been terminated. I let the pharmacist know the card information that we have on file and this was the same as what she had and is not working. I let the pharmacist know that I would call the patient and let her know.  I called the patient and left a detailed voice message to let her know that her pharmacy and also our office needs a copy of her new insurance card.

## 2021-10-02 ENCOUNTER — Ambulatory Visit: Payer: Medicaid Other | Admitting: Nurse Practitioner

## 2021-10-02 NOTE — Patient Instructions (Incomplete)

## 2021-12-06 ENCOUNTER — Encounter (HOSPITAL_COMMUNITY): Payer: Self-pay

## 2021-12-25 ENCOUNTER — Other Ambulatory Visit: Payer: Self-pay | Admitting: Nurse Practitioner

## 2022-01-03 ENCOUNTER — Telehealth: Payer: Self-pay

## 2022-01-03 DIAGNOSIS — E038 Other specified hypothyroidism: Secondary | ICD-10-CM

## 2022-01-03 MED ORDER — TIROSINT 88 MCG PO CAPS: 88.0000 ug | ORAL_CAPSULE | Freq: Every day | ORAL | 0 refills | Status: AC

## 2022-01-03 NOTE — Telephone Encounter (Signed)
I will give her a 1 month supply to hold her over until she can get labs and make it back in here for a follow up.

## 2022-01-03 NOTE — Telephone Encounter (Signed)
Pt did not answer when I tried calling.

## 2022-01-03 NOTE — Telephone Encounter (Signed)
Patient called back and states she is not able to schedule an appointment here because she no longer has a car and she can only get transportation closer to her house. She said she will more than likely have to see her PCP to continue getting this medication because there is no way she can get here.

## 2022-01-03 NOTE — Telephone Encounter (Signed)
Ok,noted

## 2022-01-03 NOTE — Telephone Encounter (Signed)
Can you schedule this patient for an in office appointment and she has to have labs also. Thank you!

## 2022-01-03 NOTE — Telephone Encounter (Signed)
Patient called after hours and stated that she has run out of her thyroid medication and is unable to make it to the office due to transportation. Patient is requesting a refill.  Last OV 02/08/2021  Please advise.

## 2023-06-12 DIAGNOSIS — C50911 Malignant neoplasm of unspecified site of right female breast: Secondary | ICD-10-CM | POA: Diagnosis not present

## 2023-06-12 DIAGNOSIS — K219 Gastro-esophageal reflux disease without esophagitis: Secondary | ICD-10-CM | POA: Diagnosis not present

## 2023-06-12 DIAGNOSIS — E039 Hypothyroidism, unspecified: Secondary | ICD-10-CM | POA: Diagnosis not present

## 2023-06-12 DIAGNOSIS — Z299 Encounter for prophylactic measures, unspecified: Secondary | ICD-10-CM | POA: Diagnosis not present

## 2023-06-12 DIAGNOSIS — I10 Essential (primary) hypertension: Secondary | ICD-10-CM | POA: Diagnosis not present

## 2023-07-02 DIAGNOSIS — Z170 Estrogen receptor positive status [ER+]: Secondary | ICD-10-CM | POA: Diagnosis not present

## 2023-07-02 DIAGNOSIS — N6452 Nipple discharge: Secondary | ICD-10-CM | POA: Diagnosis not present

## 2023-07-02 DIAGNOSIS — R92333 Mammographic heterogeneous density, bilateral breasts: Secondary | ICD-10-CM | POA: Diagnosis not present

## 2023-07-02 DIAGNOSIS — N644 Mastodynia: Secondary | ICD-10-CM | POA: Diagnosis not present

## 2023-07-02 DIAGNOSIS — G8929 Other chronic pain: Secondary | ICD-10-CM | POA: Diagnosis not present

## 2023-07-02 DIAGNOSIS — C50511 Malignant neoplasm of lower-outer quadrant of right female breast: Secondary | ICD-10-CM | POA: Diagnosis not present

## 2023-07-03 DIAGNOSIS — Z299 Encounter for prophylactic measures, unspecified: Secondary | ICD-10-CM | POA: Diagnosis not present

## 2023-07-03 DIAGNOSIS — I10 Essential (primary) hypertension: Secondary | ICD-10-CM | POA: Diagnosis not present

## 2023-07-03 DIAGNOSIS — K219 Gastro-esophageal reflux disease without esophagitis: Secondary | ICD-10-CM | POA: Diagnosis not present

## 2023-07-07 DIAGNOSIS — Z170 Estrogen receptor positive status [ER+]: Secondary | ICD-10-CM | POA: Diagnosis not present

## 2023-07-07 DIAGNOSIS — C50511 Malignant neoplasm of lower-outer quadrant of right female breast: Secondary | ICD-10-CM | POA: Diagnosis not present

## 2023-07-08 DIAGNOSIS — T50905A Adverse effect of unspecified drugs, medicaments and biological substances, initial encounter: Secondary | ICD-10-CM | POA: Diagnosis not present

## 2023-07-08 DIAGNOSIS — C50511 Malignant neoplasm of lower-outer quadrant of right female breast: Secondary | ICD-10-CM | POA: Diagnosis not present

## 2023-07-08 DIAGNOSIS — Z780 Asymptomatic menopausal state: Secondary | ICD-10-CM | POA: Diagnosis not present

## 2023-07-08 DIAGNOSIS — Z170 Estrogen receptor positive status [ER+]: Secondary | ICD-10-CM | POA: Diagnosis not present

## 2023-07-08 DIAGNOSIS — M8580 Other specified disorders of bone density and structure, unspecified site: Secondary | ICD-10-CM | POA: Diagnosis not present

## 2023-08-12 DIAGNOSIS — Z122 Encounter for screening for malignant neoplasm of respiratory organs: Secondary | ICD-10-CM | POA: Diagnosis not present

## 2023-08-12 DIAGNOSIS — Z87891 Personal history of nicotine dependence: Secondary | ICD-10-CM | POA: Diagnosis not present

## 2023-10-15 DIAGNOSIS — I700 Atherosclerosis of aorta: Secondary | ICD-10-CM | POA: Diagnosis not present

## 2023-10-15 DIAGNOSIS — E785 Hyperlipidemia, unspecified: Secondary | ICD-10-CM | POA: Diagnosis not present

## 2023-10-15 DIAGNOSIS — Z1331 Encounter for screening for depression: Secondary | ICD-10-CM | POA: Diagnosis not present

## 2023-10-15 DIAGNOSIS — Z1339 Encounter for screening examination for other mental health and behavioral disorders: Secondary | ICD-10-CM | POA: Diagnosis not present

## 2023-10-15 DIAGNOSIS — C50911 Malignant neoplasm of unspecified site of right female breast: Secondary | ICD-10-CM | POA: Diagnosis not present

## 2023-10-15 DIAGNOSIS — I10 Essential (primary) hypertension: Secondary | ICD-10-CM | POA: Diagnosis not present

## 2023-10-15 DIAGNOSIS — Z7189 Other specified counseling: Secondary | ICD-10-CM | POA: Diagnosis not present

## 2023-10-15 DIAGNOSIS — I2510 Atherosclerotic heart disease of native coronary artery without angina pectoris: Secondary | ICD-10-CM | POA: Diagnosis not present

## 2023-10-15 DIAGNOSIS — Z0000 Encounter for general adult medical examination without abnormal findings: Secondary | ICD-10-CM | POA: Diagnosis not present

## 2023-10-15 DIAGNOSIS — Z299 Encounter for prophylactic measures, unspecified: Secondary | ICD-10-CM | POA: Diagnosis not present

## 2023-12-19 DIAGNOSIS — Z299 Encounter for prophylactic measures, unspecified: Secondary | ICD-10-CM | POA: Diagnosis not present

## 2023-12-19 DIAGNOSIS — F339 Major depressive disorder, recurrent, unspecified: Secondary | ICD-10-CM | POA: Diagnosis not present

## 2023-12-19 DIAGNOSIS — I10 Essential (primary) hypertension: Secondary | ICD-10-CM | POA: Diagnosis not present

## 2023-12-19 DIAGNOSIS — G43909 Migraine, unspecified, not intractable, without status migrainosus: Secondary | ICD-10-CM | POA: Diagnosis not present

## 2023-12-19 DIAGNOSIS — R222 Localized swelling, mass and lump, trunk: Secondary | ICD-10-CM | POA: Diagnosis not present

## 2023-12-19 DIAGNOSIS — Z0000 Encounter for general adult medical examination without abnormal findings: Secondary | ICD-10-CM | POA: Diagnosis not present

## 2024-01-02 DIAGNOSIS — C50511 Malignant neoplasm of lower-outer quadrant of right female breast: Secondary | ICD-10-CM | POA: Diagnosis not present

## 2024-01-02 DIAGNOSIS — Z170 Estrogen receptor positive status [ER+]: Secondary | ICD-10-CM | POA: Diagnosis not present

## 2024-01-12 DIAGNOSIS — Z780 Asymptomatic menopausal state: Secondary | ICD-10-CM | POA: Diagnosis not present

## 2024-01-12 DIAGNOSIS — M8580 Other specified disorders of bone density and structure, unspecified site: Secondary | ICD-10-CM | POA: Diagnosis not present

## 2024-01-12 DIAGNOSIS — Z170 Estrogen receptor positive status [ER+]: Secondary | ICD-10-CM | POA: Diagnosis not present

## 2024-01-12 DIAGNOSIS — C50511 Malignant neoplasm of lower-outer quadrant of right female breast: Secondary | ICD-10-CM | POA: Diagnosis not present

## 2024-01-12 DIAGNOSIS — Z09 Encounter for follow-up examination after completed treatment for conditions other than malignant neoplasm: Secondary | ICD-10-CM | POA: Diagnosis not present

## 2024-01-12 DIAGNOSIS — R599 Enlarged lymph nodes, unspecified: Secondary | ICD-10-CM | POA: Diagnosis not present

## 2024-01-12 DIAGNOSIS — T50905A Adverse effect of unspecified drugs, medicaments and biological substances, initial encounter: Secondary | ICD-10-CM | POA: Diagnosis not present

## 2024-01-13 DIAGNOSIS — M8580 Other specified disorders of bone density and structure, unspecified site: Secondary | ICD-10-CM | POA: Diagnosis not present

## 2024-01-13 DIAGNOSIS — Z780 Asymptomatic menopausal state: Secondary | ICD-10-CM | POA: Diagnosis not present

## 2024-01-13 DIAGNOSIS — T50905A Adverse effect of unspecified drugs, medicaments and biological substances, initial encounter: Secondary | ICD-10-CM | POA: Diagnosis not present

## 2024-01-16 DIAGNOSIS — E034 Atrophy of thyroid (acquired): Secondary | ICD-10-CM | POA: Diagnosis not present

## 2024-01-16 DIAGNOSIS — R599 Enlarged lymph nodes, unspecified: Secondary | ICD-10-CM | POA: Diagnosis not present

## 2024-01-16 DIAGNOSIS — R222 Localized swelling, mass and lump, trunk: Secondary | ICD-10-CM | POA: Diagnosis not present

## 2024-01-16 DIAGNOSIS — C50911 Malignant neoplasm of unspecified site of right female breast: Secondary | ICD-10-CM | POA: Diagnosis not present

## 2024-01-16 DIAGNOSIS — R590 Localized enlarged lymph nodes: Secondary | ICD-10-CM | POA: Diagnosis not present

## 2024-01-16 DIAGNOSIS — I10 Essential (primary) hypertension: Secondary | ICD-10-CM | POA: Diagnosis not present

## 2024-01-16 DIAGNOSIS — R52 Pain, unspecified: Secondary | ICD-10-CM | POA: Diagnosis not present

## 2024-01-16 DIAGNOSIS — R918 Other nonspecific abnormal finding of lung field: Secondary | ICD-10-CM | POA: Diagnosis not present

## 2024-01-16 DIAGNOSIS — Z299 Encounter for prophylactic measures, unspecified: Secondary | ICD-10-CM | POA: Diagnosis not present

## 2024-02-04 DIAGNOSIS — I10 Essential (primary) hypertension: Secondary | ICD-10-CM | POA: Diagnosis not present

## 2024-02-04 DIAGNOSIS — E039 Hypothyroidism, unspecified: Secondary | ICD-10-CM | POA: Diagnosis not present

## 2024-02-04 DIAGNOSIS — Z299 Encounter for prophylactic measures, unspecified: Secondary | ICD-10-CM | POA: Diagnosis not present

## 2024-02-16 DIAGNOSIS — Z299 Encounter for prophylactic measures, unspecified: Secondary | ICD-10-CM | POA: Diagnosis not present

## 2024-02-16 DIAGNOSIS — J438 Other emphysema: Secondary | ICD-10-CM | POA: Diagnosis not present

## 2024-02-16 DIAGNOSIS — I700 Atherosclerosis of aorta: Secondary | ICD-10-CM | POA: Diagnosis not present

## 2024-02-16 DIAGNOSIS — I10 Essential (primary) hypertension: Secondary | ICD-10-CM | POA: Diagnosis not present

## 2024-06-29 DIAGNOSIS — L659 Nonscarring hair loss, unspecified: Secondary | ICD-10-CM | POA: Diagnosis not present

## 2024-06-29 DIAGNOSIS — I10 Essential (primary) hypertension: Secondary | ICD-10-CM | POA: Diagnosis not present

## 2024-06-29 DIAGNOSIS — J438 Other emphysema: Secondary | ICD-10-CM | POA: Diagnosis not present

## 2024-06-29 DIAGNOSIS — Z299 Encounter for prophylactic measures, unspecified: Secondary | ICD-10-CM | POA: Diagnosis not present

## 2024-06-29 DIAGNOSIS — C50911 Malignant neoplasm of unspecified site of right female breast: Secondary | ICD-10-CM | POA: Diagnosis not present

## 2024-06-29 DIAGNOSIS — I878 Other specified disorders of veins: Secondary | ICD-10-CM | POA: Diagnosis not present

## 2024-07-14 DIAGNOSIS — Z1231 Encounter for screening mammogram for malignant neoplasm of breast: Secondary | ICD-10-CM | POA: Diagnosis not present

## 2024-08-17 DIAGNOSIS — C50511 Malignant neoplasm of lower-outer quadrant of right female breast: Secondary | ICD-10-CM | POA: Diagnosis not present

## 2024-08-17 DIAGNOSIS — Z170 Estrogen receptor positive status [ER+]: Secondary | ICD-10-CM | POA: Diagnosis not present

## 2024-08-20 DIAGNOSIS — Z299 Encounter for prophylactic measures, unspecified: Secondary | ICD-10-CM | POA: Diagnosis not present

## 2024-08-20 DIAGNOSIS — Z23 Encounter for immunization: Secondary | ICD-10-CM | POA: Diagnosis not present

## 2024-08-20 DIAGNOSIS — L659 Nonscarring hair loss, unspecified: Secondary | ICD-10-CM | POA: Diagnosis not present

## 2024-08-20 DIAGNOSIS — I10 Essential (primary) hypertension: Secondary | ICD-10-CM | POA: Diagnosis not present

## 2025-02-14 ENCOUNTER — Inpatient Hospital Stay

## 2025-02-14 ENCOUNTER — Inpatient Hospital Stay: Admitting: Oncology
